# Patient Record
Sex: Female | Born: 1965 | Hispanic: Yes | Marital: Married | Smoking: Never smoker
Health system: Southern US, Community
[De-identification: ages and names within clinical notes are randomized; demographics above are authoritative.]

## PROBLEM LIST (undated history)

## (undated) DIAGNOSIS — H0016 Chalazion left eye, unspecified eyelid: Secondary | ICD-10-CM

## (undated) DIAGNOSIS — R350 Frequency of micturition: Secondary | ICD-10-CM

## (undated) DIAGNOSIS — K219 Gastro-esophageal reflux disease without esophagitis: Secondary | ICD-10-CM

## (undated) DIAGNOSIS — H0013 Chalazion right eye, unspecified eyelid: Secondary | ICD-10-CM

## (undated) DIAGNOSIS — M79662 Pain in left lower leg: Secondary | ICD-10-CM

## (undated) DIAGNOSIS — E559 Vitamin D deficiency, unspecified: Secondary | ICD-10-CM

## (undated) DIAGNOSIS — N809 Endometriosis, unspecified: Secondary | ICD-10-CM

## (undated) DIAGNOSIS — N840 Polyp of corpus uteri: Secondary | ICD-10-CM

## (undated) DIAGNOSIS — J45909 Unspecified asthma, uncomplicated: Secondary | ICD-10-CM

## (undated) DIAGNOSIS — M79661 Pain in right lower leg: Secondary | ICD-10-CM

## (undated) DIAGNOSIS — M5126 Other intervertebral disc displacement, lumbar region: Secondary | ICD-10-CM

## (undated) DIAGNOSIS — D649 Anemia, unspecified: Secondary | ICD-10-CM

## (undated) HISTORY — DX: Chalazion right eye, unspecified eyelid: H00.13

## (undated) HISTORY — DX: Endometriosis, unspecified: N80.9

## (undated) HISTORY — DX: Chalazion left eye, unspecified eyelid: H00.16

## (undated) HISTORY — DX: Other intervertebral disc displacement, lumbar region: M51.26

## (undated) HISTORY — DX: Vitamin D deficiency, unspecified: E55.9

## (undated) HISTORY — PX: PELVIC LAPAROSCOPY: SHX162

## (undated) HISTORY — DX: Gastro-esophageal reflux disease without esophagitis: K21.9

---

## 2003-06-30 HISTORY — PX: DILATION AND CURETTAGE OF UTERUS: SHX78

## 2006-06-29 HISTORY — PX: BREAST BIOPSY: SHX20

## 2015-05-17 ENCOUNTER — Telehealth: Payer: Self-pay | Admitting: Behavioral Health

## 2015-05-17 NOTE — Telephone Encounter (Signed)
Unable to reach patient at time of Pre-Visit Call.  Left message for patient to return call when available.    

## 2015-05-20 ENCOUNTER — Encounter: Payer: Self-pay | Admitting: Physician Assistant

## 2015-05-20 ENCOUNTER — Ambulatory Visit (INDEPENDENT_AMBULATORY_CARE_PROVIDER_SITE_OTHER): Payer: No Typology Code available for payment source | Admitting: Physician Assistant

## 2015-05-20 ENCOUNTER — Ambulatory Visit: Payer: Self-pay | Admitting: Physician Assistant

## 2015-05-20 ENCOUNTER — Telehealth: Payer: Self-pay | Admitting: Physician Assistant

## 2015-05-20 VITALS — BP 105/65 | HR 75 | Temp 97.9°F | Resp 16 | Ht 60.0 in | Wt 124.5 lb

## 2015-05-20 DIAGNOSIS — M5412 Radiculopathy, cervical region: Secondary | ICD-10-CM | POA: Diagnosis not present

## 2015-05-20 DIAGNOSIS — L270 Generalized skin eruption due to drugs and medicaments taken internally: Secondary | ICD-10-CM | POA: Insufficient documentation

## 2015-05-20 DIAGNOSIS — R7989 Other specified abnormal findings of blood chemistry: Secondary | ICD-10-CM | POA: Diagnosis not present

## 2015-05-20 LAB — VITAMIN D 25 HYDROXY (VIT D DEFICIENCY, FRACTURES): VITD: 46.61 ng/mL (ref 30.00–100.00)

## 2015-05-20 MED ORDER — DICLOFENAC SODIUM 3 % EX CREA: 1.0000 "application " | TOPICAL_CREAM | Freq: Every day | CUTANEOUS | Status: DC

## 2015-05-20 MED ORDER — METHYLPREDNISOLONE 4 MG PO TBPK: ORAL_TABLET | ORAL | Status: DC

## 2015-05-20 MED ORDER — NEOMYCIN-POLYMYXIN-DEXAMETH 3.5-10000-0.1 OP OINT: 1.0000 "application " | TOPICAL_OINTMENT | Freq: Three times a day (TID) | OPHTHALMIC | Status: DC

## 2015-05-20 NOTE — Telephone Encounter (Signed)
Caller name: Lucilla  Relation to pt: self Call back number: [phone] Pharmacy: [facility] [address] [place] [employer]  Reason for call: Pt called mentioning was seen at office today 05-20-15 and stated  was needing rx  neomycin-polymyxin b-dexamethasone (MAXITROL) 3.5-10000-0.1 SUSP  and DICLOFENAC SODIUM EX, she does not have any left , pt states did receive rx methylPREDNISolone (MEDROL DOSEPAK) 4 MG TBPK tablet  at the pharmacy but not the others that she needed as well. Pt also informed gave the wrong Pharmacy and would like to have it sent to the rx mentioned above, please change on chart. Please Advise.

## 2015-05-20 NOTE — Telephone Encounter (Signed)
Medications sent

## 2015-05-20 NOTE — Progress Notes (Signed)
Pre visit review using our clinic review tool, if applicable. No additional management support is needed unless otherwise documented below in the visit note/SLS  

## 2015-05-20 NOTE — Patient Instructions (Signed)
Please take the steroid as directed. This will help with neck pain and your rash. Stop using the topical cream on your neck as it is likely the cause of rash. Use extra-strength tylenol for breakthrough neck pain. Avoid heavy lifting or overexertion.  I will call you with your lab results. Use the compression stockings given as directed.

## 2015-05-21 ENCOUNTER — Telehealth: Payer: Self-pay | Admitting: Physician Assistant

## 2015-05-21 ENCOUNTER — Encounter: Payer: Self-pay | Admitting: Physician Assistant

## 2015-05-21 MED ORDER — DICLOFENAC SODIUM 3 % EX CREA: 1.0000 "application " | TOPICAL_CREAM | Freq: Two times a day (BID) | CUTANEOUS | Status: DC | PRN

## 2015-05-21 NOTE — Telephone Encounter (Signed)
Caller name: [facility]  Call back number: [phone]   Reason for call:  As per pharmacy Diclofenac Sodium 3 % CREA requesting clarification regarding supply or dose. Please clarify

## 2015-05-21 NOTE — Telephone Encounter (Signed)
Has been handled 

## 2015-05-21 NOTE — Progress Notes (Unsigned)
Received call from pharmacy. States they need to know how many grams you want patient to apply of the Diclofenac 3% Gel. Maximum amount per day is no more than 8 gm per pharmacist.

## 2015-05-21 NOTE — Telephone Encounter (Signed)
Caller name: [facility]   Relationship to patient: [facility]   Can be reached: [phone]    Reason for call: called back in to inform PCP and CMA that (per your previous discussion) medication is going to be 1,600. She's requesting a call back to discuss further options for pt.

## 2015-05-21 NOTE — Telephone Encounter (Signed)
Rx resent to pharmacy with provider's instructions/SLS

## 2015-05-21 NOTE — Telephone Encounter (Signed)
Apply up to twice daily as needed for low back pain

## 2015-05-21 NOTE — Progress Notes (Signed)
That is great. Thank you.  

## 2015-05-21 NOTE — Progress Notes (Signed)
2mg  twice daily

## 2015-05-21 NOTE — Progress Notes (Unsigned)
Per pharmacy Diclofenac 3% Gel would cost patient $1,600.00. States they can get Voltaren 1% for $80.00 she would need to apply 4gm bid to back. Advised this would be ok with you. Will call back if not just let me know.

## 2015-05-24 NOTE — Assessment & Plan Note (Signed)
Will repeat Vitamin D level today. 

## 2015-05-24 NOTE — Progress Notes (Signed)
Patient presents to clinic today c/o cervical neck pain, intermittent, x 1 month. Has now been consistent over the past week with radiation into RUE with tingling and weakness. Denies trauma or injury. Denies pain elsewhere. Has not taken anything for symptoms.  Patient also c/o burning red rash of neck s/p use of Aspercreme. States she stopped the medication once she realized the rash was appearing only in places where cream was used.  Patient endorses history of low vitamin D requiring treatment. Is requesting repeat labs.  Past Medical History  Diagnosis Date  . Chalazion of both eyes   . Vitamin D deficiency   . Lumbar herniated disc     L5  . Endometriosis     No current outpatient prescriptions on file prior to visit.   No current facility-administered medications on file prior to visit.    No Known Allergies  Family History  Problem Relation Age of Onset  . Prostate cancer Father 47    Deceased  . Lymphoma Mother 49    Neck-Deceased  . COPD Maternal Grandmother   . Healthy Brother     x5  . Healthy Sister     x8    Social History   Social History  . Marital Status: Married    Spouse Name: N/A  . Number of Children: N/A  . Years of Education: N/A   Social History Main Topics  . Smoking status: Never Smoker   . Smokeless tobacco: Never Used  . Alcohol Use: No  . Drug Use: No  . Sexual Activity: Not Asked   Other Topics Concern  . None   Social History Narrative  . None   Review of Systems  Constitutional: Negative for fever and weight loss.  HENT: Negative for ear discharge, ear pain, hearing loss and tinnitus.   Eyes: Negative for blurred vision, double vision, photophobia and pain.  Respiratory: Negative for cough and shortness of breath.   Cardiovascular: Negative for chest pain and palpitations.  Gastrointestinal: Negative for heartburn, nausea, vomiting, abdominal pain, diarrhea, constipation, blood in stool and melena.  Genitourinary:  Negative for dysuria, urgency, frequency, hematuria and flank pain.  Musculoskeletal: Positive for neck pain. Negative for falls.  Skin: Positive for rash.  Neurological: Negative for dizziness, loss of consciousness and headaches.  Endo/Heme/Allergies: Negative for environmental allergies.  Psychiatric/Behavioral: Negative for depression, suicidal ideas, hallucinations and substance abuse. The patient is not nervous/anxious and does not have insomnia.      BP 105/65 mmHg  Pulse 75  Temp(Src) 97.9 F (36.6 C) (Oral)  Resp 16  Ht 5' (1.524 m)  Wt 124 lb 8 oz (56.473 kg)  BMI 24.31 kg/m2  SpO2 97%  LMP 05/14/2015  Physical Exam  Constitutional: She is oriented to person, place, and time and well-developed, well-nourished, and in no distress.  HENT:  Head: Normocephalic and atraumatic.  Eyes: Conjunctivae are normal.  Cardiovascular: Normal rate, regular rhythm, normal heart sounds and intact distal pulses.   Pulmonary/Chest: Effort normal and breath sounds normal. No respiratory distress. She has no wheezes. She has no rales. She exhibits no tenderness.  Musculoskeletal:       Cervical back: She exhibits pain. She exhibits no tenderness and no bony tenderness.  Neurological: She is alert and oriented to person, place, and time.  Skin: Skin is warm and dry.     Psychiatric: Affect normal.  Vitals reviewed.   Recent Results (from the past 2160 hour(s))  Vitamin D (25 hydroxy)  Status: None   Collection Time: 05/20/15 10:30 AM  Result Value Ref Range   VITD 46.61 30.00 - 100.00 ng/mL    Assessment/Plan: Cervical radicular pain Rx Medrol dose pack. Continue Voltaren gel. Supportive measures reviewed. Follow-up if symptoms are not resolving.  Drug rash Aspercreme. Patient has stopped medication. Steroid pack for cervicalgia will also help with this. Supportive measures reviewed. Aspercreme added to allergies/intolerance list.  Low serum vitamin D Will repeat Vitamin D  level today.

## 2015-05-24 NOTE — Assessment & Plan Note (Signed)
Aspercreme. Patient has stopped medication. Steroid pack for cervicalgia will also help with this. Supportive measures reviewed. Aspercreme added to allergies/intolerance list.

## 2015-05-24 NOTE — Assessment & Plan Note (Signed)
Rx Medrol dose pack. Continue Voltaren gel. Supportive measures reviewed. Follow-up if symptoms are not resolving.

## 2015-07-24 ENCOUNTER — Ambulatory Visit (INDEPENDENT_AMBULATORY_CARE_PROVIDER_SITE_OTHER): Payer: No Typology Code available for payment source | Admitting: Physician Assistant

## 2015-07-24 ENCOUNTER — Encounter: Payer: Self-pay | Admitting: Physician Assistant

## 2015-07-24 VITALS — BP 104/71 | HR 73 | Temp 97.6°F | Ht 60.0 in | Wt 129.0 lb

## 2015-07-24 DIAGNOSIS — J208 Acute bronchitis due to other specified organisms: Secondary | ICD-10-CM

## 2015-07-24 MED ORDER — BENZONATATE 100 MG PO CAPS: 100.0000 mg | ORAL_CAPSULE | Freq: Two times a day (BID) | ORAL | Status: DC | PRN

## 2015-07-24 MED ORDER — ALBUTEROL SULFATE HFA 108 (90 BASE) MCG/ACT IN AERS: 2.0000 | INHALATION_SPRAY | Freq: Four times a day (QID) | RESPIRATORY_TRACT | Status: DC | PRN

## 2015-07-24 NOTE — Assessment & Plan Note (Signed)
With some mild wheeze. Supportive measures reviewed. Rx Albuterol sent in. Rx Tessalon for cough. Follow-up if symptoms are not resolved.

## 2015-07-24 NOTE — Progress Notes (Signed)
Pre visit review using our clinic review tool, if applicable. No additional management support is needed unless otherwise documented below in the visit note. 

## 2015-07-24 NOTE — Progress Notes (Signed)
    Patient presents to clinic today c/o 3 days of fatigue, dry cough, ear pressure bilaterally and chest tightness with wheezing. Denies fever, chills, chest pain. Denies history of asthma, allergy or history of smoking. Her husband is sick with similar symptoms.  Past Medical History  Diagnosis Date  . Chalazion of both eyes   . Vitamin D deficiency   . Lumbar herniated disc     L5  . Endometriosis     Current Outpatient Prescriptions on File Prior to Visit  Medication Sig Dispense Refill  . Diclofenac Sodium 3 % CREA Apply 1 application topically 2 (two) times daily as needed. Apply One Application Twice Daily As Needed For Low Back Pain (Patient not taking: Reported on 07/24/2015) 2500 g 1   No current facility-administered medications on file prior to visit.    No Known Allergies  Family History  Problem Relation Age of Onset  . Prostate cancer Father 6    Deceased  . Lymphoma Mother 22    Neck-Deceased  . COPD Maternal Grandmother   . Healthy Brother     x5  . Healthy Sister     x8    Social History   Social History  . Marital Status: Married    Spouse Name: N/A  . Number of Children: N/A  . Years of Education: N/A   Social History Main Topics  . Smoking status: Never Smoker   . Smokeless tobacco: Never Used  . Alcohol Use: No  . Drug Use: No  . Sexual Activity: Not Asked   Other Topics Concern  . None   Social History Narrative   Review of Systems - See HPI.  All other ROS are negative.  BP 104/71 mmHg  Pulse 73  Temp(Src) 97.6 F (36.4 C) (Oral)  Ht 5' (1.524 m)  Wt 129 lb (58.514 kg)  BMI 25.19 kg/m2  SpO2 100%  Physical Exam  Constitutional: She is oriented to person, place, and time and well-developed, well-nourished, and in no distress.  HENT:  Head: Normocephalic and atraumatic.  Right Ear: External ear normal.  Left Ear: External ear normal.  Nose: Nose normal.  Mouth/Throat: Oropharynx is clear and moist. No oropharyngeal  exudate.  TM within normal limits bilaterally  Eyes: Pupils are equal, round, and reactive to light.  Neck: Neck supple.  Cardiovascular: Normal rate, regular rhythm, normal heart sounds and intact distal pulses.   Pulmonary/Chest: Effort normal. No respiratory distress. She has wheezes. She has no rales. She exhibits no tenderness.  Neurological: She is alert and oriented to person, place, and time.  Skin: Skin is warm and dry. No rash noted.  Psychiatric: Affect normal.  Vitals reviewed.   Recent Results (from the past 2160 hour(s))  Vitamin D (25 hydroxy)     Status: None   Collection Time: 05/20/15 10:30 AM  Result Value Ref Range   VITD 46.61 30.00 - 100.00 ng/mL    Assessment/Plan: Viral bronchitis With some mild wheeze. Supportive measures reviewed. Rx Albuterol sent in. Rx Tessalon for cough. Follow-up if symptoms are not resolved.

## 2015-07-24 NOTE — Patient Instructions (Signed)
Please stay well hydrated and get plenty of rest. Place a humidifier in the bedroom. Get some Mucinex to take twice daily as directed. Use the Tessalon given as directed for cough. Use the Albuterol inhaler as directed for wheezing.  Follow-up if symptoms are not resolving.  Metered Dose Inhaler (No Spacer Used) Inhaled medicines are the basis of treatment for asthma and other breathing problems. Inhaled medicine can only be effective if used properly. Good technique assures that the medicine reaches the lungs. Metered dose inhalers (MDIs) are used to deliver a variety of inhaled medicines. These include quick relief or rescue medicines (such as bronchodilators) and controller medicines (such as corticosteroids). The medicine is delivered by pushing down on a metal canister to release a set amount of spray. If you are using different kinds of inhalers, use your quick relief medicine to open the airways 10-15 minutes before using a steroid, if instructed to do so by your health care provider. If you are unsure which inhalers to use and the order of using them, ask your health care provider, nurse, or respiratory therapist. HOW TO USE THE INHALER 1. Remove the cap from the inhaler. 2. If you are using the inhaler for the first time, you will need to prime it. Shake the inhaler for 5 seconds and release four puffs into the air, away from your face. Ask your health care provider or pharmacist if you have questions about priming your inhaler. 3. Shake the inhaler for 5 seconds before each breath in (inhalation). 4. Position the inhaler so that the top of the canister faces up. 5. Put your index finger on the top of the medicine canister. Your thumb supports the bottom of the inhaler. 6. Open your mouth. 7. Either place the inhaler between your teeth and place your lips tightly around the mouthpiece, or hold the inhaler 1-2 inches away from your open mouth. If you are unsure of which technique to use,  ask your health care provider. 8. Breathe out (exhale) normally and as completely as possible. 9. Press the canister down with the index finger to release the medicine. 10. At the same time as the canister is pressed, inhale deeply and slowly until your lungs are completely filled. This should take 4-6 seconds. Keep your tongue down. 11. Hold the medicine in your lungs for 5-10 seconds (10 seconds is best). This helps the medicine get into the small airways of your lungs. 12. Breathe out slowly, through pursed lips. Whistling is an example of pursed lips. 13. Wait at least 1 minute between puffs. Continue with the above steps until you have taken the number of puffs your health care provider has ordered. Do not use the inhaler more than your health care provider directs you to. 14. Replace the cap on the inhaler. 15. Follow the directions from your health care provider or the inhaler insert for cleaning the inhaler. If you are using a steroid inhaler, after your last puff, rinse your mouth with water, gargle, and spit out the water. Do not swallow the water. AVOID:  Inhaling before or after starting the spray of medicine. It takes practice to coordinate your breathing with triggering the spray.  Inhaling through the nose (rather than the mouth) when triggering the spray. HOW TO DETERMINE IF YOUR INHALER IS FULL OR NEARLY EMPTY You cannot know when an inhaler is empty by shaking it. Some inhalers are now being made with dose counters. Ask your health care provider for a prescription that has  a dose counter if you feel you need that extra help. If your inhaler does not have a counter, ask your health care provider to help you determine the date you need to refill your inhaler. Write the refill date on a calendar or your inhaler canister. Refill your inhaler 7-10 days before it runs out. Be sure to keep an adequate supply of medicine. This includes making sure it has not expired, and making sure you  have a spare inhaler. SEEK MEDICAL CARE IF:  Symptoms are only partially relieved with your inhaler.  You are having trouble using your inhaler.  You experience an increase in phlegm. SEEK IMMEDIATE MEDICAL CARE IF:  You feel little or no relief with your inhalers. You are still wheezing and feeling shortness of breath, tightness in your chest, or both.  You have dizziness, headaches, or a fast heart rate.  You have chills, fever, or night sweats.  There is a noticeable increase in phlegm production, or there is blood in the phlegm. MAKE SURE YOU:  Understand these instructions.  Will watch your condition.  Will get help right away if you are not doing well or get worse.   This information is not intended to replace advice given to you by your health care provider. Make sure you discuss any questions you have with your health care provider.   Document Released: 04/12/2007 Document Revised: 07/06/2014 Document Reviewed: 12/01/2012 Elsevier Interactive Patient Education [employer].

## 2015-08-12 ENCOUNTER — Other Ambulatory Visit (HOSPITAL_COMMUNITY)
Admission: RE | Admit: 2015-08-12 | Discharge: 2015-08-12 | Disposition: A | Discharge status: Home / Self Care | Payer: No Typology Code available for payment source | Source: Ambulatory Visit | Attending: Gynecology | Admitting: Gynecology

## 2015-08-12 ENCOUNTER — Ambulatory Visit (INDEPENDENT_AMBULATORY_CARE_PROVIDER_SITE_OTHER): Payer: No Typology Code available for payment source | Admitting: Gynecology

## 2015-08-12 ENCOUNTER — Encounter: Payer: Self-pay | Admitting: Gynecology

## 2015-08-12 VITALS — BP 118/76 | Ht 62.0 in | Wt 124.0 lb

## 2015-08-12 DIAGNOSIS — Z8639 Personal history of other endocrine, nutritional and metabolic disease: Secondary | ICD-10-CM | POA: Diagnosis not present

## 2015-08-12 DIAGNOSIS — Z1151 Encounter for screening for human papillomavirus (HPV): Secondary | ICD-10-CM | POA: Insufficient documentation

## 2015-08-12 DIAGNOSIS — Z01419 Encounter for gynecological examination (general) (routine) without abnormal findings: Secondary | ICD-10-CM | POA: Insufficient documentation

## 2015-08-12 DIAGNOSIS — N912 Amenorrhea, unspecified: Secondary | ICD-10-CM

## 2015-08-12 DIAGNOSIS — E785 Hyperlipidemia, unspecified: Secondary | ICD-10-CM | POA: Diagnosis not present

## 2015-08-12 DIAGNOSIS — L659 Nonscarring hair loss, unspecified: Secondary | ICD-10-CM | POA: Diagnosis not present

## 2015-08-12 LAB — PREGNANCY, URINE: Preg Test, Ur: NEGATIVE

## 2015-08-12 NOTE — Progress Notes (Signed)
Chelsea Barrett Dec 09, 1965 [id number]   History:    50 y.o.  for annual gyn exam who is a new patient to the practice. Patient with several complaints today. Patient stated she has not had a menstrual cycle since December and January she had a slightly brownish discharge and has not had any bleeding since December. She denies any nausea, vomiting, no nipple discharge or unusual headaches or double vision. She is not using any form of contraception. 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 Patient had laparoscopy in another facility several years ago and she was diagnosed with endometriosis and occlusion of the left fallopian tube. She denies any vasomotor symptoms but she does complain of some alopecia and has had vitamin D deficiency in the past which she is currently taking calcium and vitamin D. Also she had brought lab work from her prior physician back in April and her BUN/creatinine ratio was slightly elevated at 27 normal range 9-23 and also she had a vitamin D level that was 19.8 and she was treated with 50,000 units every weekly for 12 weeks and stated that on follow-up her vitamin D level was back to normal she's taken thousand units daily. Also her labs that shown that her total cholesterol had been elevated at 224 and her LDL was elevated at 120 the rest of the lipid profile was normal. Her urine Sudie Grumbling today was negative.  Past medical history,surgical history, family history and social history were all reviewed and documented in the EPIC chart.  Gynecologic History Patient's last menstrual period was 06/11/2015. Contraception: none Last Pap: Several years ago different facility. Results were: Patient reports it was normal Last mammogram: 2016. Results were: Dense breasts but normal  Obstetric History OB History  Gravida Para Term Preterm AB SAB TAB Ectopic Multiple Living  0 0 0 0 0 0 0 0 0 0          ROS: A ROS was performed and pertinent positives and  negatives are included in the history.  GENERAL: No fevers or chills. HEENT: No change in vision, no earache, sore throat or sinus congestion. NECK: No pain or stiffness. CARDIOVASCULAR: No chest pain or pressure. No palpitations. PULMONARY: No shortness of breath, cough or wheeze. GASTROINTESTINAL: No abdominal pain, nausea, vomiting or diarrhea, melena or bright red blood per rectum. GENITOURINARY: No urinary frequency, urgency, hesitancy or dysuria. MUSCULOSKELETAL: No joint or muscle pain, no back pain, no recent trauma. DERMATOLOGIC: No rash, no itching, no lesions. ENDOCRINE: No polyuria, polydipsia, no heat or cold intolerance. No recent change in weight. HEMATOLOGICAL: No anemia or easy bruising or bleeding. NEUROLOGIC: No headache, seizures, numbness, tingling or weakness. PSYCHIATRIC: No depression, no loss of interest in normal activity or change in sleep pattern.     Exam: chaperone present  BP 118/76 mmHg  Ht 5\' 2"  (1.575 m)  Wt 124 lb (56.246 kg)  BMI 22.67 kg/m2  LMP 06/11/2015  Body mass index is 22.67 kg/(m^2).  General appearance : Well developed well nourished female. No acute distress HEENT: Eyes: no retinal hemorrhage or exudates,  Neck supple, trachea midline, no carotid bruits, no thyroidmegaly Lungs: Clear to auscultation, no rhonchi or wheezes, or rib retractions  Heart: Regular rate and rhythm, no murmurs or gallops Breast:Examined in sitting and supine position were symmetrical in appearance, no palpable masses or tenderness,  no skin retraction, no nipple inversion, no nipple discharge, no skin discoloration, no axillary or supraclavicular lymphadenopathy Abdomen: no palpable masses or  tenderness, no rebound or guarding Extremities: no edema or skin discoloration or tenderness  Pelvic:  Bartholin, Urethra, Skene Glands: Within normal limits             Vagina: No gross lesions or discharge  Cervix: No gross lesions or discharge  Uterus  anteverted, normal size,  shape and consistency, non-tender and mobile  Adnexa  Without masses or tenderness  Anus and perineum  normal   Rectovaginal  normal sphincter tone without palpated masses or tenderness             Hemoccult not indicated     Assessment/Plan:  50 y.o. female for annual exam who will return later in this week for the following screening blood work: Fasting lipid profile, TSH, CBC, urinalysis and fasting lipid profile. As a result of her secondary amenorrhea we are going to check an [facility] along with her prolactin. Because her vitamin D deficiency in the past we will check a vitamin D level today. Because of her alopecia we'll check a testosterone level today as well. A Pap smear with HPV screening was done today. She was provided with a requisition to schedule a three-dimensional mammogram for next month. She will return back to the office next week to discuss her results and plan a course of management.   Terrance Mass MD, 1:02 PM 08/12/2015

## 2015-08-13 ENCOUNTER — Other Ambulatory Visit: Payer: No Typology Code available for payment source

## 2015-08-13 LAB — CBC WITH DIFFERENTIAL/PLATELET
BASOS ABS: 0.1 10*3/uL (ref 0.0–0.1)
Basophils Relative: 1 % (ref 0–1)
EOS ABS: 0.2 10*3/uL (ref 0.0–0.7)
EOS PCT: 3 % (ref 0–5)
HCT: 40.2 % (ref 36.0–46.0)
Hemoglobin: 13.1 g/dL (ref 12.0–15.0)
LYMPHS ABS: 2.2 10*3/uL (ref 0.7–4.0)
LYMPHS PCT: 34 % (ref 12–46)
MCH: 27.3 pg (ref 26.0–34.0)
MCHC: 32.6 g/dL (ref 30.0–36.0)
MCV: 83.9 fL (ref 78.0–100.0)
MPV: 10.8 fL (ref 8.6–12.4)
Monocytes Absolute: 0.3 10*3/uL (ref 0.1–1.0)
Monocytes Relative: 5 % (ref 3–12)
NEUTROS PCT: 57 % (ref 43–77)
Neutro Abs: 3.6 10*3/uL (ref 1.7–7.7)
PLATELETS: 296 10*3/uL (ref 150–400)
RBC: 4.79 MIL/uL (ref 3.87–5.11)
RDW: 13.7 % (ref 11.5–15.5)
WBC: 6.4 10*3/uL (ref 4.0–10.5)

## 2015-08-13 LAB — THYROID PANEL WITH TSH
FREE THYROXINE INDEX: 1.8 (ref 1.4–3.8)
T3 UPTAKE: 28 % (ref 22–35)
T4 TOTAL: 6.5 ug/dL (ref 4.5–12.0)
TSH: 1.64 mIU/L

## 2015-08-13 LAB — URINALYSIS W MICROSCOPIC + REFLEX CULTURE
Bacteria, UA: NONE SEEN [HPF]
Bilirubin Urine: NEGATIVE
CASTS: NONE SEEN [LPF]
CRYSTALS: NONE SEEN [HPF]
Glucose, UA: NEGATIVE
HGB URINE DIPSTICK: NEGATIVE
KETONES UR: NEGATIVE
Leukocytes, UA: NEGATIVE
NITRITE: NEGATIVE
PH: 5 (ref 5.0–8.0)
Protein, ur: NEGATIVE
RBC / HPF: NONE SEEN RBC/HPF (ref <=2)
SPECIFIC GRAVITY, URINE: 1.028 (ref 1.001–1.035)
WBC, UA: NONE SEEN WBC/HPF (ref <=5)
YEAST: NONE SEEN [HPF]

## 2015-08-13 LAB — COMPREHENSIVE METABOLIC PANEL
ALBUMIN: 3.7 g/dL (ref 3.6–5.1)
ALK PHOS: 49 U/L (ref 33–115)
ALT: 18 U/L (ref 6–29)
AST: 17 U/L (ref 10–35)
BILIRUBIN TOTAL: 0.6 mg/dL (ref 0.2–1.2)
BUN: 17 mg/dL (ref 7–25)
CO2: 26 mmol/L (ref 20–31)
CREATININE: 0.72 mg/dL (ref 0.50–1.10)
Calcium: 8.8 mg/dL (ref 8.6–10.2)
Chloride: 104 mmol/L (ref 98–110)
Glucose, Bld: 84 mg/dL (ref 65–99)
Potassium: 4.3 mmol/L (ref 3.5–5.3)
SODIUM: 137 mmol/L (ref 135–146)
TOTAL PROTEIN: 6.5 g/dL (ref 6.1–8.1)

## 2015-08-13 LAB — LIPID PANEL
CHOLESTEROL: 204 mg/dL — AB (ref 125–200)
HDL: 92 mg/dL (ref >=46)
LDL Cholesterol: 101 mg/dL (ref <130)
TRIGLYCERIDES: 55 mg/dL (ref <150)
Total CHOL/HDL Ratio: 2.2 Ratio (ref <=5.0)
VLDL: 11 mg/dL (ref <30)

## 2015-08-14 LAB — TESTOSTERONE: TESTOSTERONE: 39 ng/dL

## 2015-08-14 LAB — FOLLICLE STIMULATING HORMONE: FSH: 6.2 m[IU]/mL

## 2015-08-14 LAB — VITAMIN D 25 HYDROXY (VIT D DEFICIENCY, FRACTURES): Vit D, 25-Hydroxy: 30 ng/mL (ref 30–100)

## 2015-08-14 LAB — CYTOLOGY - PAP

## 2015-08-14 LAB — PROLACTIN: PROLACTIN: 11.7 ng/mL

## 2015-08-22 ENCOUNTER — Telehealth: Payer: Self-pay | Admitting: *Deleted

## 2015-08-22 ENCOUNTER — Ambulatory Visit (INDEPENDENT_AMBULATORY_CARE_PROVIDER_SITE_OTHER): Payer: No Typology Code available for payment source | Admitting: Gynecology

## 2015-08-22 ENCOUNTER — Encounter: Payer: Self-pay | Admitting: Gynecology

## 2015-08-22 ENCOUNTER — Other Ambulatory Visit: Payer: Self-pay

## 2015-08-22 VITALS — BP 102/68

## 2015-08-22 DIAGNOSIS — Z309 Encounter for contraceptive management, unspecified: Secondary | ICD-10-CM

## 2015-08-22 DIAGNOSIS — N979 Female infertility, unspecified: Secondary | ICD-10-CM

## 2015-08-22 DIAGNOSIS — N3942 Incontinence without sensory awareness: Secondary | ICD-10-CM

## 2015-08-22 DIAGNOSIS — Z8639 Personal history of other endocrine, nutritional and metabolic disease: Secondary | ICD-10-CM | POA: Diagnosis not present

## 2015-08-22 DIAGNOSIS — N915 Oligomenorrhea, unspecified: Secondary | ICD-10-CM

## 2015-08-22 DIAGNOSIS — E785 Hyperlipidemia, unspecified: Secondary | ICD-10-CM | POA: Diagnosis not present

## 2015-08-22 DIAGNOSIS — Z30011 Encounter for initial prescription of contraceptive pills: Secondary | ICD-10-CM

## 2015-08-22 DIAGNOSIS — Z1231 Encounter for screening mammogram for malignant neoplasm of breast: Secondary | ICD-10-CM

## 2015-08-22 DIAGNOSIS — L659 Nonscarring hair loss, unspecified: Secondary | ICD-10-CM | POA: Diagnosis not present

## 2015-08-22 MED ORDER — NORETHIN ACE-ETH ESTRAD-FE 1-20 MG-MCG PO TABS: 1.0000 | ORAL_TABLET | Freq: Every day | ORAL | Status: DC

## 2015-08-22 MED ORDER — NORETHINDRONE ACET-ETHINYL EST 1.5-30 MG-MCG PO TABS: 1.0000 | ORAL_TABLET | Freq: Every day | ORAL | Status: DC

## 2015-08-22 NOTE — Telephone Encounter (Signed)
Chelsea Barrett will relay to patient. Correct Rx sent

## 2015-08-22 NOTE — Progress Notes (Signed)
   Patient is a 50 year old that presented to the office today to discuss her lab tests. She was seen as a new patient for the first time on 08/12/2015. Her history is as follows:  "Patient with several complaints today. Patient stated she has not had a menstrual cycle since December and January she had a slightly brownish discharge and has not had any bleeding since December. She denies any nausea, vomiting, no nipple discharge or unusual headaches or double vision. She is not using any form of contraception. 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 Patient had laparoscopy in another facility several years ago and she was diagnosed with endometriosis and occlusion of the left fallopian tube. She denies any vasomotor symptoms but she does complain of some alopecia and has had vitamin D deficiency in the past which she is currently taking calcium and vitamin D. Also she had brought lab work from her prior physician back in April and her BUN/creatinine ratio was slightly elevated at 27 normal range 9-23 and also she had a vitamin D level that was 19.8 and she was treated with 50,000 units every weekly for 12 weeks and stated that on follow-up her vitamin D level was back to normal she's taken thousand units daily. Also her labs that shown that her total cholesterol had been elevated at 224 and her LDL was elevated at 120 the rest of the lipid profile was normal."  She informed me that she had a spontaneous menses last Friday which lasted for 6 days was very heavy the first 3 days. We reviewed all her labs and the following was discussed: Comprehensive metabolic panel, vitamin D level, CBC, TSH, FSH, prolactin were all normal. Her total testosterone was 39. Her lipid profile normal with the exception of total cholesterol slightly elevated at 204. Pap smear was normal. Patient due for mammogram in April of this year.  Patient had informed me today also that she did experience some  urinary leakage when she was laughing recently but there were a few episodes. She denies nocturia. She denies any urge incontinence or any urinary frequency. Patient has had history in the past of primary infertility no longer interested in getting pregnant. We discussed treatment options to include the following:  Option A: Provera 10 mg daily for 10 days if she does not have a spontaneous menses every 30 days and if a home pregnancy test is negative. Option B: Low-dose 20 g oral contraceptive pill for contraception as well as to regulate her cycle and also for smooth transition into the menopause since she is currently 50 years of age.  She has decided to proceed with low dose oral contraceptive pill. The risk benefits and pros and cons were discussed. Patient would no prior history of any clotting or bleeding disorders or any family history.  Assessment/plan: #1 oligomenorrhea and negative workup will be regulated with low-dose 20 g oral contraceptive pill such as Loestrin 1/20 FE which she will start today. Literature information was provided in Spanish #2 isolated episodes of stress urinary incontinence literature information on Keagle exercises was provided in Spanish #3 past history vitamin D deficiency currently taking vitamin D 2000 units daily recent vitamin D level was normal.  Greater than 90% of the time was spent in counseling correlating care for this patient with chronic anovulation perimenopausal. Time of consultation 20 minutes.

## 2015-08-22 NOTE — Patient Instructions (Addendum)
Informacin sobre los Electronics engineer (Oral Contraception Information) Los anticonceptivos orales (ACO) son medicamentos que se utilizan para Therapist, occupational. Su funcin es [employer] ovarios liberen vulos. Las hormonas de los ACO tambin hacen que el moco cervical se haga ms espeso, lo que evita que el esperma ingrese al tero. Tambin hacen que la membrana que recubre internamente al tero se vuelva ms fina, lo que no permite que el huevo fertilizado se adhiera a la pared del tero. Los ACO son muy efectivos cuando se toman exactamente como se prescriben. Sin embargo, no previenen contra las enfermedades de transmisin sexual (ETS). La prctica del sexo seguro, como el uso de preservativos, junto con la [place], [place] a prevenir ese tipo de enfermedades.  Antes de tomar la pldora, usted debe hacerse un examen fsico y un test de Pap. El mdico podr indicarle anlisis de [place], si es necesario. El mdico se asegurar de que usted sea [place] buena candidata para usar anticonceptivos orales. Converse con su mdico acerca de los posibles efectos secundarios de los ACO que podran recetarle. Cuando se inicia el uso de ACO, puede llevar 2 a 3 meses para que su organismo se adapte a los cambios en los niveles hormonales.  TIPOS DE ANTICONCEPTIVOS ORALES 1. Pldora combinada: esta pldora contiene las hormonas estrgeno y progestina (progesterona sinttica). La pldora combinada viene en envases para [address], [place]. Algunos tipos de pldoras combinadas deben tomarse de manera continua (pldoras para 365 das). En los envases para [address], usted no tomar las pldoras durante 7 das despus de la ltima pldora. En los envases para [address] la pldora se toma [employer]. Las ltimas 7 no contienen hormonas. Ciertos tipos de pldoras tienen ms de 21 pldoras que contienen hormonas. En los envases para [address] las primeras 84 pldoras contienen ambas hormonas y las ltimas 7 pldoras  no contienen hormonas o contienen slo Therapist, occupational. 2. La minipldora: esta pldora contiene la hormona progesterona solamente. Es necesario tomarla todos los das de [place] continua. Es importante que las tome a la misma hora todos [place]. Viene en envases de 28 pldoras. Las 28 pldoras contienen la hormona.  Gail los sntomas premenstruales.   Se [place] para tratar los [employer].   Regula el ciclo menstrual.   Disminuye el ciclo menstrual abundante.   Puede mejorar el acn, segn el tipo de pldora.   Trata hemorragias uterinas anormales.   Trata el sndrome ovrico poliqustico.   Trata la endometriosis.   Pueden usarse como anticonceptivo de Freight forwarder.  FACTORES QUE PUEDEN HACER QUE LOS ANTICONCEPTIVOS ORALES SEAN MENOS EFECTIVOS Pueden ser menos efectivos si:   Olvid tomar la [employer] a la misma hora.   Tiene una enfermedad estomacal o intestinal que disminuye la absorcin de la pldora.   Ingiere simultneamente los anticonceptivos orales junto con otros medicamentos que los hacen menos efectivos, como antibiticos, ciertos medicamentos para el VIH y algunos medicamentos para las convulsiones.   Usted toma anticonceptivos orales que han vencido.   Cuando se [place] el envase de 21 das, se olvida de recomenzar el uso [employer] 7.  RIESGOS ASOCIADOS AL USO DE ANTICONCEPTIVOS ORALES  Los anticonceptivos orales pueden en algunos casos causar efectos secundarios como:  Dolor de [place].  Nuseas.  Inflamacin mamaria.  Hemorragia vaginal o manchado irregular. Las pldoras combinadas tambin se asocian a un pequeo aumento en el riesgo de:  Cogulos  sanguneos.  Ataque cardaco.  Ictus.   Esta informacin no tiene Marine scientist el consejo del mdico. Asegrese de hacerle al mdico cualquier pregunta que tenga.   Document Released: 03/25/2005 Document Revised:  04/05/2013 Elsevier Interactive Patient Education 2016 [place] de Kegel  (Kegel Exercises) El objetivo de los ejercicios de Kegel es aislar y Chief Technology Officer los msculos del suelo plvico. Estos msculos actan como una hamaca que soporta el recto, la vagina, el intestino delgado y [place]. A medida que los msculos se debilitan, se hunden y esos rganos son desplazados de sus posiciones normales. Los ejercicios de Kegel fortalecen los msculos del suelo plvico y ayudan a Garment/textile technologist control de la vejiga y del intestino, mejoran la respuesta sexual y Therapist, music a Proofreader problemas y Dispensing optician durante el [place]. Se pueden hacer en cualquier lugar y en cualquier momento.  [place] LOS EJERCICIOS DE KEGEL  3. Localice los msculos del suelo plvico. Para ello apriete (contraiga) los msculos que se utilizan para tratar de Scientist, water quality el flujo de [place]. Usted se sentir una opresin en el rea vaginal (mujeres) y que se eleva y comprime la zona rectal (hombres y mujeres). 4. Para comenzar, contraiga los msculos plvicos durante 2 a 5 segundos y despus reljelos durante 2 a 5 segundos. Esta es una serie. Repita 4 a 5 series con una breve pausa en el medio. 5. Contraiga los msculos de la pelvis durante 8 a 10 segundos y despus reljelos durante 8 a 10 segundos. Repita 4 a 5 series. Si no puede contraer los msculos de la pelvis durante 8 a 10 segundos, trate de 5 a 7 segundos y Higher education careers adviser 8 a 10 segundos. Su objetivo es Optometrist 4 a 5 series de 10 Passenger transport manager. Mantenga el estmago, las nalgas y las piernas relajadas [place]. Realice ambas series de contracciones, cortas y largas. [place] posiciones. [place] contracciones 3 a 4 veces por Training and development officer. [place] series mientras est:    Acostado en la cama por la [place].  De pie en el almuerzo.  Sentado por las tardes.  Acostado en la cama por la noche.  Debe hacer 40 a 50 contracciones por  da. No realice ms ejercicios de Kegel por da que lo recomendado. El exceso de ejercicio puede causar fatiga muscular. Contine con estos ejercicios durante al menos 15 a 20 semanas o segn las indicaciones de su mdico.    Esta informacin no tiene Marine scientist el consejo del mdico. Asegrese de hacerle al mdico cualquier pregunta que tenga.   Document Released: 06/01/2012 Elsevier Interactive Patient Education [employer].

## 2015-08-22 NOTE — Telephone Encounter (Signed)
I want her to take the Loestrin 12/20 there are 21 tablets in the remainder on iron tablet take as prescribed

## 2015-08-22 NOTE — Telephone Encounter (Signed)
Pt called Chelsea Barrett with questions regarding her birth control Rx she received today at office visit.   1. Pt said you told her you were going to prescribe a 28 day pack of pills, but only at 21 day pack was sent.  2. Should patient have Loestrin 1/20 FE pack? Or Loestrin Fe 1.5/30 mcg tablet? You office note states 20 mcg tablet will be prescribed, but 1.5/30 mcg was sent to pharmacy.  Please advise

## 2015-08-27 ENCOUNTER — Other Ambulatory Visit: Payer: Self-pay | Admitting: Physician Assistant

## 2015-09-18 ENCOUNTER — Ambulatory Visit: Payer: Self-pay | Admitting: Gynecology

## 2015-10-02 ENCOUNTER — Ambulatory Visit
Admission: RE | Admit: 2015-10-02 | Discharge: 2015-10-02 | Disposition: A | Discharge status: Home / Self Care | Payer: No Typology Code available for payment source | Source: Ambulatory Visit

## 2015-10-02 ENCOUNTER — Other Ambulatory Visit: Payer: Self-pay

## 2015-10-02 DIAGNOSIS — Z1231 Encounter for screening mammogram for malignant neoplasm of breast: Secondary | ICD-10-CM

## 2015-12-05 ENCOUNTER — Encounter: Payer: Self-pay | Admitting: Medical

## 2015-12-05 ENCOUNTER — Ambulatory Visit (INDEPENDENT_AMBULATORY_CARE_PROVIDER_SITE_OTHER): Payer: No Typology Code available for payment source | Admitting: Medical

## 2015-12-05 VITALS — BP 102/64 | HR 59 | Temp 98.1°F | Resp 16 | Ht 62.0 in | Wt 121.2 lb

## 2015-12-05 DIAGNOSIS — L858 Other specified epidermal thickening: Secondary | ICD-10-CM

## 2015-12-05 DIAGNOSIS — K140 Glossitis: Secondary | ICD-10-CM

## 2015-12-05 DIAGNOSIS — L739 Follicular disorder, unspecified: Secondary | ICD-10-CM | POA: Diagnosis not present

## 2015-12-05 DIAGNOSIS — K146 Glossodynia: Secondary | ICD-10-CM | POA: Diagnosis not present

## 2015-12-05 DIAGNOSIS — Q829 Congenital malformation of skin, unspecified: Secondary | ICD-10-CM | POA: Diagnosis not present

## 2015-12-05 MED ORDER — CEPHALEXIN 500 MG PO CAPS: 500.0000 mg | ORAL_CAPSULE | Freq: Two times a day (BID) | ORAL | Status: DC

## 2015-12-05 MED ORDER — MAGIC MOUTHWASH: ORAL | Status: DC

## 2015-12-05 MED ORDER — FLUCONAZOLE 150 MG PO TABS: 150.0000 mg | ORAL_TABLET | Freq: Once | ORAL | Status: DC

## 2015-12-05 MED FILL — CMPD MMW LID/MAALOX/DIPHEN: 10 days supply | Qty: 200 | Fill #0

## 2015-12-05 NOTE — Patient Instructions (Addendum)
For your tongue inflammation and pain. Will rx magic mouth wash.   For glossitis work up can get b12 level and cbc.  For sore area on lip can use oragel.  If by Sunday mouth still sore then use diflucan.(fungus can sometimes cause above symptoms)  For keratosis pilaris and folliculitis rx cephalexin antibiotic and can use [employer] Exfoliating scrub.  Follow up in 2 weeks or as needed

## 2015-12-05 NOTE — Progress Notes (Signed)
Pre visit review using our clinic review tool, if applicable. No additional management support is needed unless otherwise documented below in the visit note. 

## 2015-12-05 NOTE — Progress Notes (Signed)
   Subjective:    Patient ID: Chelsea Barrett, female    DOB: [birth year], 50 y.o.   MRN: [id number]  HPI  Pt in with some mild irritation. When she swallows. Some pain on tip of tongue and tender area inside of lip. Pt states she eats healthy diet. This came up just 2 days ago.   Pt has some bumps on her arms 2 months ago. No pain. No itching. She states never had this before.   No treatment otc for any of the above conditions.    Review of Systems  Constitutional: Negative for fever, chills and fatigue.  HENT: Negative for congestion, facial swelling, nosebleeds, rhinorrhea, sinus pressure and sore throat.        Tongue pain an lip pain. See hpi.  Cardiovascular: Negative for chest pain and palpitations.  Gastrointestinal: Negative for abdominal pain.  Skin: Positive for rash.       See  hpi.       Objective:   Physical Exam  General- No acute distress. Pleasant patient. Lungs- Clear, even and unlabored. Heart- regular rate and rhythm. Neurologic- CNII- XII grossly intact.  Mouth- mild red inflamed appearance to tongue. More at rt side  tip of tongue. This area very sensitive. Lower lip- no obvious abnormality. But faint tender buccal mucosa. Area that gum/lip near front lower teeth.   Skin- on back of both arms. Appearance of moderate keratosis pilaris and follicuitis.       Assessment & Plan:  For your tongue inflammation and pain. Will rx magic mouth wash.   For glossitis work up can get b12 level and cbc.  For sore area on lip can use oragel.  If by Sunday mouth still sore then use diflucan.(fungus can sometimes cause above symptoms)  For keratosis pilaris and folliculitis rx cephalexin antibiotic and can use [employer] Exfoliating scrub.  Follow up in 2 weeks or as needed

## 2015-12-06 ENCOUNTER — Ambulatory Visit: Payer: No Typology Code available for payment source | Admitting: Physician Assistant

## 2015-12-06 LAB — VITAMIN B12: Vitamin B-12: >1500 pg/mL — ABNORMAL HIGH (ref 211–911)

## 2015-12-06 LAB — CBC WITH DIFFERENTIAL/PLATELET
BASOS PCT: 1 % (ref 0.0–3.0)
Basophils Absolute: 0.1 10*3/uL (ref 0.0–0.1)
EOS ABS: 0.5 10*3/uL (ref 0.0–0.7)
Eosinophils Relative: 5.8 % — ABNORMAL HIGH (ref 0.0–5.0)
HCT: 39.6 % (ref 36.0–46.0)
Hemoglobin: 13.3 g/dL (ref 12.0–15.0)
LYMPHS ABS: 2.7 10*3/uL (ref 0.7–4.0)
Lymphocytes Relative: 30.9 % (ref 12.0–46.0)
MCHC: 33.5 g/dL (ref 30.0–36.0)
MCV: 83.5 fl (ref 78.0–100.0)
MONO ABS: 0.5 10*3/uL (ref 0.1–1.0)
Monocytes Relative: 6 % (ref 3.0–12.0)
NEUTROS ABS: 4.9 10*3/uL (ref 1.4–7.7)
NEUTROS PCT: 56.3 % (ref 43.0–77.0)
Platelets: 242 10*3/uL (ref 150.0–400.0)
RBC: 4.74 Mil/uL (ref 3.87–5.11)
RDW: 12.7 % (ref 11.5–15.5)
WBC: 8.8 10*3/uL (ref 4.0–10.5)

## 2015-12-12 ENCOUNTER — Ambulatory Visit: Payer: No Typology Code available for payment source | Admitting: Family Medicine

## 2015-12-12 ENCOUNTER — Ambulatory Visit (INDEPENDENT_AMBULATORY_CARE_PROVIDER_SITE_OTHER): Payer: No Typology Code available for payment source | Admitting: Family Medicine

## 2015-12-12 VITALS — BP 98/65 | HR 81 | Temp 98.5°F | Ht 62.0 in | Wt 119.2 lb

## 2015-12-12 DIAGNOSIS — Q829 Congenital malformation of skin, unspecified: Secondary | ICD-10-CM | POA: Diagnosis not present

## 2015-12-12 DIAGNOSIS — K1379 Other lesions of oral mucosa: Secondary | ICD-10-CM | POA: Diagnosis not present

## 2015-12-12 DIAGNOSIS — L858 Other specified epidermal thickening: Secondary | ICD-10-CM

## 2015-12-12 MED ORDER — CLOTRIMAZOLE 10 MG MT TROC: 10.0000 mg | Freq: Every day | OROMUCOSAL | Status: DC

## 2015-12-12 MED ORDER — FLUOCINONIDE 0.05 % EX GEL: 1.0000 "application " | Freq: Two times a day (BID) | CUTANEOUS | Status: DC

## 2015-12-12 NOTE — Progress Notes (Signed)
Pre visit review using our clinic review tool, if applicable. No additional management support is needed unless otherwise documented below in the visit note. 

## 2015-12-12 NOTE — Progress Notes (Signed)
[place] at [facility] [address] [place], [place] [address] [phone]  Date:  12/12/2015   Name:  Chelsea Barrett   DOB:  07-09-1965   MRN:  [id number]  PCP:  Chelsea Rio, PA-C    Chief Complaint: Follow-up   History of Present Illness:  Chelsea Barrett is a 50 y.o. very pleasant female patient who presents with the following:  Here today to recheck mouth sores.  She was seen here on 6/8 and treated with diflucan/ magic mouthwsh for glossitis She notes that she is wrose as far as her mouth pain and sores.   She otherwise feels well She has been under stress as her husband recently had a stroke.  She used keflex for her folliculitis and this is improved- she still has bumps on her upper arms c/w keratoris pilaris   LMP 5/29 No fever or chills It does hurt to eat but she is still eating  Wt Readings from Last 3 Encounters:  12/12/15 119 lb 3.2 oz (54.069 kg)  12/05/15 121 lb 3.2 oz (54.976 kg)  08/12/15 124 lb (56.246 kg)     Patient Active Problem List   Diagnosis Date Noted  . Hyperlipidemia 08/12/2015  . H/O vitamin D deficiency 08/12/2015  . Alopecia 08/12/2015  . Amenorrhea 08/12/2015  . Cervical radicular pain 05/20/2015  . Drug rash 05/20/2015  . Low serum vitamin D 05/20/2015    Past Medical History  Diagnosis Date  . Chalazion of both eyes   . Vitamin D deficiency   . Lumbar herniated disc     L5  . Endometriosis     Past Surgical History  Procedure Laterality Date  . Breast biopsy  2008    No malignancy  . Dilation and curettage of uterus  2005  . Pelvic laparoscopy      Social History  Substance Use Topics  . Smoking status: Never Smoker   . Smokeless tobacco: Never Used  . Alcohol Use: No    Family History  Problem Relation Age of Onset  . Prostate cancer Father 43    Deceased  . Lymphoma Mother 27    Neck-Deceased  . COPD Maternal Grandmother   . Healthy Brother    x5  . Healthy Sister     x8    No Known Allergies  Medication list has been reviewed and updated.  Current Outpatient Prescriptions on File Prior to Visit  Medication Sig Dispense Refill  . cephALEXin (KEFLEX) 500 MG capsule Take 1 capsule (500 mg total) by mouth 2 (two) times daily. 14 capsule 0  . diclofenac sodium (VOLTAREN) 1 % GEL APPLY ONE APPLICATION (FOUR GRAMS) TWICE DAILY TO AFFECTED AREA AS NEEDED FOR BACK PAIN  12  . fluconazole (DIFLUCAN) 150 MG tablet Take 1 tablet (150 mg total) by mouth once. 1 tablet 0  . magic mouthwash SOLN 5 ml po qid swish and spit 200 mL 0  . norethindrone-ethinyl estradiol (JUNEL FE,GILDESS FE,LOESTRIN FE) 1-20 MG-MCG tablet Take 1 tablet by mouth daily. 1 Package 11  . VENTOLIN HFA 108 (90 Base) MCG/ACT inhaler INHALE 2 PUFFS INTO THE LUNGS EVERY 6 (SIX) HOURS AS NEEDED FOR WHEEZING OR SHORTNESS OF BREATH. 18 Inhaler 0  . [DISCONTINUED] Norethindrone Acetate-Ethinyl Estradiol (JUNEL,LOESTRIN,MICROGESTIN) 1.5-30 MG-MCG tablet Take 1 tablet by mouth daily. 1 Package 11   No current facility-administered medications on file prior to visit.    Review of Systems: She does have  complaint of hot flashes.  She had a normal TSH in February As per HPI- otherwise negative.   Physical Examination: Filed Vitals:   12/12/15 1015  BP: 98/65  Pulse: 81  Temp: 98.5 F (36.9 C)   Filed Vitals:   12/12/15 1015  Weight: 119 lb 3.2 oz (54.069 kg)   Body mass index is 21.8 kg/(m^2). Ideal Body Weight:    GEN: WDWN, NAD, Non-toxic, A & O x 3, normal weight, looks well HEENT: Atraumatic, Normocephalic. Neck supple. No masses, No LAD.  Bilateral TM wnl, oropharynx shows no exudate or tonsillar enlargement,. She does have several aphthous ulcers on the mucosa.  PEERL,EOMI.   Ears and Nose: No external deformity. CV: RRR, No M/G/R. No JVD. No thrill. No extra heart sounds. PULM: CTA B, no wheezes, crackles, rhonchi. No retractions. No resp. distress. No  accessory muscle use. EXTR: No c/c/e NEURO Normal gait.  PSYCH: Normally interactive. Conversant. Not depressed or anxious appearing.  Calm demeanor.  Scattered discrete nodules of keratosis pilaris on her bilateral arms    Assessment and Plan: Mouth sores - Plan: fluocinonide gel (LIDEX) 0.05 %, clotrimazole (MYCELEX) 10 MG troche  Keratosis pilaris  Treat wiht fluicinonide gel and also mycelex in case any element of thrush Recent B12 level normal Discussed treatment for KP with OTC products Asked her to let me know if not better in 1 weeks   Signed Lamar Blinks, MD

## 2015-12-12 NOTE — Patient Instructions (Signed)
Use the medicated losenges and also apply a small amount of the gel to any sores in your mouth as needed Let me know if your symptoms are not gone in one week- Sooner if worse.    For your arms, OTC products containing salicylic acid and/ or urea can be helpful to clear up your skin.

## 2015-12-17 ENCOUNTER — Encounter: Payer: Self-pay | Admitting: Gynecology

## 2015-12-17 ENCOUNTER — Ambulatory Visit (INDEPENDENT_AMBULATORY_CARE_PROVIDER_SITE_OTHER): Payer: No Typology Code available for payment source | Admitting: Gynecology

## 2015-12-17 VITALS — BP 110/70

## 2015-12-17 DIAGNOSIS — N911 Secondary amenorrhea: Secondary | ICD-10-CM

## 2015-12-17 LAB — TSH: TSH: 1.41 mIU/L

## 2015-12-17 LAB — PREGNANCY, URINE: Preg Test, Ur: NEGATIVE

## 2015-12-17 MED ORDER — MEDROXYPROGESTERONE ACETATE 10 MG PO TABS: ORAL_TABLET | ORAL | Status: DC

## 2015-12-17 NOTE — Patient Instructions (Signed)
Amenorrea secundaria  (Secondary Amenorrhea) La amenorrea secundaria es la detencin del flujo menstrual durante 3-6 meses en una mujer que previamente tena sus perodos. Hay muchas causas posibles: La mayora de las causas no son graves. Generalmente, al tratar el problema subyacente que causa la detencin de la [place], podr volver a tener sus perodos normales. CAUSAS  Algunas causas comunes de la falta de menstruacin son:  Desnutricin.  Bajo nivel de Dispensing optician (hipoglucemia).  Enfermedad poliqustica de los ovarios.  Estrs o miedos.  Gwendlyn Deutscher.  Desequilibrio hormonal.  Insuficiencia ovrica.  Medicamentos.  Obesidad extrema.  Fibrosis qustica.  Reduccin de peso drstica por cualquier causa.  Menopausia precoz.  Extirpacin de los ovarios o del tero.  Anticonceptivos.  Enfermedades.  Enfermedades de larga duracin (crnicas).  Sndrome de [place].  Problemas de tiroides.  Pldoras, parches o anillos vaginales para el control de la natalidad. FACTORES DE RIESGO Puede tener ms riesgo de amenorrea secundaria si:  Tiene una historia familiar de este problema.  Sufre un trastorno alimentario.  Realiza entrenamiento deportivo. DIAGNSTICO  Este diagnstico la realiza el mdico por medio de la historia clnica y el examen fsico. Incluir un examen plvico para verificar si hay problemas en los rganos reproductores. Debe descartarse la posibilidad de embarazo. Generalmente se indicarn diferentes anlisis de sangre para medir diferentes tipos de hormonas en el organismo. Le indicarn anlisis de [place]. Le harn algunos estudios especializados (ecografas, tomografa computada, resonancia magntica o histeroscopa) y tambin medirn su ndice de masa corporal [facility]). TRATAMIENTO  El tratamiento depende de la causa de la [place]. Si hay un trastorno de Youth worker, deber tratarse con la dieta y la terapia [place]. Los  trastornos crnicos pueden mejorar con el tratamiento de la enfermedad. La amenorrea puede corregirse con medicamentos, cambios en el estilo de vida o con [place]. Si la amenorrea no puede corregirse, algunas veces es posible crear una falsa menstruacin con medicamentos. INSTRUCCIONES PARA EL CUIDADO EN EL HOGAR  Consuma una dieta saludable.  Controle los problemas de [place].  Haga ejercicios con regularidad, pero no excesivamente.  Duerma lo suficiente.  Controle el estrs.  Observe si hay cambios en el ciclo menstrual. Mantenga un registro del momento en que ocurren los perodos. Anote la fecha de inicio de los perodos, cunto duran y si hay problemas. SOLICITE ATENCIN MDICA SI: Los sntomas no mejoran con Dispensing optician.   Esta informacin no tiene Marine scientist el consejo del mdico. Asegrese de hacerle al mdico cualquier pregunta que tenga.   Document Released: 02/15/2013 Document Revised: 07/06/2014 Elsevier Interactive Patient Education [employer].

## 2015-12-17 NOTE — Progress Notes (Signed)
   HPI: Patient is a 50 year old who presented to the office today stating she has not had a menstrual cycle since May 13. Review of her records indicated she does have history of oligomenorrhea in the past she was last seen the office in February this year.She is a G0 P0 with long-standing history of primary infertility. Patient had laparoscopy in another facility several years ago and she was diagnosed with endometriosis and occlusion of the left fallopian tube. Patient complains of some vasomotor symptoms at time. She stated her on her sisters and her mother recently menopause after the age of 72. She denied any nipple discharge, blurry vision is only occasional headaches. She has not been using any form of contraception. Patient's husband had had a stroke recently and she has not been eating well and thought that she lost possibly 12 pounds as a result of her stress. In February this year patient had a normal FSH, TSH, and prolactin. She had been on the oral contraceptive pill that only took them for 2 months ago she developed a rash so her last time that she was on the birth control pill was in April and her menstrual cycle on May 13 was spontaneous.   ROS: A ROS was performed and pertinent positives and negatives are included in the history.  GENERAL: No fevers or chills. HEENT: No change in vision, no earache, sore throat or sinus congestion. NECK: No pain or stiffness. CARDIOVASCULAR: No chest pain or pressure. No palpitations. PULMONARY: No shortness of breath, cough or wheeze. GASTROINTESTINAL: No abdominal pain, nausea, vomiting or diarrhea, melena or bright red blood per rectum. GENITOURINARY: No urinary frequency, urgency, hesitancy or dysuria. MUSCULOSKELETAL: No joint or muscle pain, no back pain, no recent trauma. DERMATOLOGIC: No rash, no itching, no lesions. ENDOCRINE: No polyuria, polydipsia, no heat or cold intolerance. No recent change in weight. HEMATOLOGICAL: No anemia or easy bruising or  bleeding. NEUROLOGIC: No headache, seizures, numbness, tingling or weakness. PSYCHIATRIC: No depression, no loss of interest in normal activity or change in sleep pattern.   PE: Weight 124 pounds back in February she was not weighed today. Blood pressure 110/70 Well developed well nourished female in above-mentioned complaint : Abdomen soft nontender no rebound or guarding Pelvic: Bartholin urethra Skene was within normal limits Vagina: No lesions or discharge Cervix: No lesions or discharge slight tender blood was noted on the ectocervix Uterus: Anteverted normal size shape and consistency Adnexa: No palpable mass or tenderness Rectal exam: Not done  Urine presented test was negative.   Assessment Plan: Secondary amenorrhea related stress? We'll recheck a TSH, prolactin, and FSH today. If negative she was provided with a prescription for Provera 10 mg to take 1 by mouth daily for 10 days of each month if she does not have a spontaneous menses providing that she does a home (test at home first. All the above instructions were provided Spanish.    Greater than 50% of time was spent in counseling and coordinating care of this patient.   Time of consultation: 15   Minutes.

## 2015-12-18 LAB — FOLLICLE STIMULATING HORMONE: FSH: 44.4 m[IU]/mL

## 2015-12-20 ENCOUNTER — Ambulatory Visit: Payer: No Typology Code available for payment source | Admitting: Physician Assistant

## 2016-08-12 ENCOUNTER — Ambulatory Visit (INDEPENDENT_AMBULATORY_CARE_PROVIDER_SITE_OTHER): Payer: 59 | Admitting: Gynecology

## 2016-08-12 ENCOUNTER — Encounter: Payer: Self-pay | Admitting: Gynecology

## 2016-08-12 VITALS — BP 110/78 | Ht 62.0 in | Wt 119.0 lb

## 2016-08-12 DIAGNOSIS — N951 Menopausal and female climacteric states: Secondary | ICD-10-CM

## 2016-08-12 DIAGNOSIS — R102 Pelvic and perineal pain: Secondary | ICD-10-CM | POA: Diagnosis not present

## 2016-08-12 DIAGNOSIS — E559 Vitamin D deficiency, unspecified: Secondary | ICD-10-CM

## 2016-08-12 DIAGNOSIS — Z01411 Encounter for gynecological examination (general) (routine) with abnormal findings: Secondary | ICD-10-CM

## 2016-08-12 LAB — CBC WITH DIFFERENTIAL/PLATELET
BASOS ABS: 0 {cells}/uL (ref 0–200)
Basophils Relative: 0 %
EOS PCT: 2 %
Eosinophils Absolute: 118 cells/uL (ref 15–500)
HCT: 38.1 % (ref 35.0–45.0)
Hemoglobin: 12.7 g/dL (ref 11.7–15.5)
Lymphocytes Relative: 34 %
Lymphs Abs: 2006 cells/uL (ref 850–3900)
MCH: 28.1 pg (ref 27.0–33.0)
MCHC: 33.3 g/dL (ref 32.0–36.0)
MCV: 84.3 fL (ref 80.0–100.0)
MONOS PCT: 5 %
MPV: 10 fL (ref 7.5–12.5)
Monocytes Absolute: 295 cells/uL (ref 200–950)
Neutro Abs: 3481 cells/uL (ref 1500–7800)
Neutrophils Relative %: 59 %
PLATELETS: 264 10*3/uL (ref 140–400)
RBC: 4.52 MIL/uL (ref 3.80–5.10)
RDW: 14.1 % (ref 11.0–15.0)
WBC: 5.9 10*3/uL (ref 3.8–10.8)

## 2016-08-12 LAB — COMPREHENSIVE METABOLIC PANEL
ALK PHOS: 61 U/L (ref 33–115)
ALT: 12 U/L (ref 6–29)
AST: 15 U/L (ref 10–35)
Albumin: 4 g/dL (ref 3.6–5.1)
BUN: 18 mg/dL (ref 7–25)
CHLORIDE: 106 mmol/L (ref 98–110)
CO2: 25 mmol/L (ref 20–31)
Calcium: 9.1 mg/dL (ref 8.6–10.2)
Creat: 0.72 mg/dL (ref 0.50–1.10)
GLUCOSE: 79 mg/dL (ref 65–99)
POTASSIUM: 3.8 mmol/L (ref 3.5–5.3)
Sodium: 140 mmol/L (ref 135–146)
Total Bilirubin: 0.4 mg/dL (ref 0.2–1.2)
Total Protein: 6.6 g/dL (ref 6.1–8.1)

## 2016-08-12 LAB — LIPID PANEL
Cholesterol: 210 mg/dL — ABNORMAL HIGH (ref <200)
HDL: 93 mg/dL (ref >50)
LDL CALC: 101 mg/dL — AB (ref <100)
Total CHOL/HDL Ratio: 2.3 Ratio (ref <5.0)
Triglycerides: 79 mg/dL (ref <150)
VLDL: 16 mg/dL (ref <30)

## 2016-08-12 LAB — TSH: TSH: 1.76 mIU/L

## 2016-08-12 NOTE — Progress Notes (Signed)
Chelsea Barrett [birth year] [id number]   History:    51 y.o.  for annual gyn exam with history of skipping periods. Her [place] was elevated at 44 last year she has very little vasomotor symptoms. Patient with past history of endometriosis. Complaints of dyspareunia and low pelvic discomfort at times. Patient with past history vitamin D deficiency. Patient with no past history of any abnormal Pap smears.  Past medical history,surgical history, family history and social history were all reviewed and documented in the EPIC chart.  Gynecologic History Patient's last menstrual period was 08/08/2016 (approximate). Contraception: none Last Pap: 2017. Results were: normal Last mammogram: 2017. Results were: normal  Obstetric History OB History  Gravida Para Term Preterm AB Living  0 0 0 0 0 0  SAB TAB Ectopic Multiple Live Births  0 0 0 0           ROS: A ROS was performed and pertinent positives and negatives are included in the history.  GENERAL: No fevers or chills. HEENT: No change in vision, no earache, sore throat or sinus congestion. NECK: No pain or stiffness. CARDIOVASCULAR: No chest pain or pressure. No palpitations. PULMONARY: No shortness of breath, cough or wheeze. GASTROINTESTINAL: No abdominal pain, nausea, vomiting or diarrhea, melena or bright red blood per rectum. GENITOURINARY: No urinary frequency, urgency, hesitancy or dysuria. MUSCULOSKELETAL: No joint or muscle pain, no back pain, no recent trauma. DERMATOLOGIC: No rash, no itching, no lesions. ENDOCRINE: No polyuria, polydipsia, no heat or cold intolerance. No recent change in weight. HEMATOLOGICAL: No anemia or easy bruising or bleeding. NEUROLOGIC: No headache, seizures, numbness, tingling or weakness. PSYCHIATRIC: No depression, no loss of interest in normal activity or change in sleep pattern.     Exam: chaperone present  BP 110/78   Ht 5\' 2"  (1.575 m)   Wt 119 lb (54 kg)   LMP 08/08/2016 (Approximate)   BMI  21.77 kg/m   Body mass index is 21.77 kg/m.  General appearance : Well developed well nourished female. No acute distress HEENT: Eyes: no retinal hemorrhage or exudates,  Neck supple, trachea midline, no carotid bruits, no thyroidmegaly Lungs: Clear to auscultation, no rhonchi or wheezes, or rib retractions  Heart: Regular rate and rhythm, no murmurs or gallops Breast:Examined in sitting and supine position were symmetrical in appearance, no palpable masses or tenderness,  no skin retraction, no nipple inversion, no nipple discharge, no skin discoloration, no axillary or supraclavicular lymphadenopathy Abdomen: no palpable masses or tenderness, no rebound or guarding Extremities: no edema or skin discoloration or tenderness  Pelvic:  Bartholin, Urethra, Skene Glands: Within normal limits             Vagina: No gross lesions or discharge, menstrual blood  Cervix: No gross lesions or discharge  Uterus  anteverted, normal size, shape and consistency, non-tender and mobile  Adnexa  Without masses or tenderness  Anus and perineum  normal   Rectovaginal  normal sphincter tone without palpated masses or tenderness             Hemoccult not indicated     Assessment/Plan:  51 y.o. female for annual exam will return back next week for an ultrasound due to the fact that she was tender and I could not get an adequate pelvic exam today. The following fasting blood work will be ordered today: Fasting lipid profile comprehensive metabolic panel, TSH, and CBC. Patient declined flu vaccine today. She was reminded to schedule her Mammogram. Pap smear not indicated  this year.   Terrance Mass MD, 9:40 AM 08/12/2016

## 2016-08-13 LAB — URINALYSIS W MICROSCOPIC + REFLEX CULTURE
BACTERIA UA: NONE SEEN [HPF]
BILIRUBIN URINE: NEGATIVE
CASTS: NONE SEEN [LPF]
CRYSTALS: NONE SEEN [HPF]
Glucose, UA: NEGATIVE
KETONES UR: NEGATIVE
Leukocytes, UA: NEGATIVE
Nitrite: NEGATIVE
Protein, ur: NEGATIVE
RBC / HPF: NONE SEEN RBC/HPF (ref <=2)
SPECIFIC GRAVITY, URINE: 1.019 (ref 1.001–1.035)
Squamous Epithelial / LPF: NONE SEEN [HPF] (ref <=5)
WBC, UA: NONE SEEN WBC/HPF (ref <=5)
Yeast: NONE SEEN [HPF]
pH: 5 (ref 5.0–8.0)

## 2016-08-13 LAB — VITAMIN D 25 HYDROXY (VIT D DEFICIENCY, FRACTURES): Vit D, 25-Hydroxy: 28 ng/mL — ABNORMAL LOW (ref 30–100)

## 2016-08-19 ENCOUNTER — Other Ambulatory Visit: Payer: Self-pay | Admitting: Gynecology

## 2016-08-19 ENCOUNTER — Encounter: Payer: Self-pay | Admitting: Gynecology

## 2016-08-19 ENCOUNTER — Ambulatory Visit (INDEPENDENT_AMBULATORY_CARE_PROVIDER_SITE_OTHER): Payer: 59 | Admitting: Gynecology

## 2016-08-19 ENCOUNTER — Ambulatory Visit (INDEPENDENT_AMBULATORY_CARE_PROVIDER_SITE_OTHER): Payer: 59

## 2016-08-19 VITALS — Ht 62.0 in | Wt 119.0 lb

## 2016-08-19 DIAGNOSIS — R938 Abnormal findings on diagnostic imaging of other specified body structures: Secondary | ICD-10-CM

## 2016-08-19 DIAGNOSIS — R102 Pelvic and perineal pain: Secondary | ICD-10-CM | POA: Diagnosis not present

## 2016-08-19 DIAGNOSIS — R9389 Abnormal findings on diagnostic imaging of other specified body structures: Secondary | ICD-10-CM

## 2016-08-19 DIAGNOSIS — N859 Noninflammatory disorder of uterus, unspecified: Secondary | ICD-10-CM | POA: Diagnosis not present

## 2016-08-19 DIAGNOSIS — N9489 Other specified conditions associated with female genital organs and menstrual cycle: Secondary | ICD-10-CM | POA: Diagnosis not present

## 2016-08-19 DIAGNOSIS — D251 Intramural leiomyoma of uterus: Secondary | ICD-10-CM

## 2016-08-19 DIAGNOSIS — N941 Unspecified dyspareunia: Secondary | ICD-10-CM

## 2016-08-19 DIAGNOSIS — E559 Vitamin D deficiency, unspecified: Secondary | ICD-10-CM | POA: Diagnosis not present

## 2016-08-19 DIAGNOSIS — N858 Other specified noninflammatory disorders of uterus: Secondary | ICD-10-CM

## 2016-08-19 NOTE — Progress Notes (Signed)
  Patient is a 51 year old who presented to the office today for her ultrasound. She was seen the office for annual exam on February 14 and had been complaining of skipping cycles at times. She is also having some dyspareunia and low pelvic discomfort and this is the reason for the ultrasound. She is also here to discuss results from last office visit.  Her vitamin D level was found to be low at 28, lipid profile indicated total cholesterol slightly elevated with a value of 210 and LDL at 101 the remainder the parameters were normal. Her CBC and conference metabolic panel and TSH were normal. She had a [place] in 2017 which was in the menopausal range with a value of 44.4. Her vasomotor symptoms and very minimal if any.  Ultrasound: Uterus measuring 9.0 x 5.1 x 4.1 cm endometrial stripe 17.5 mm (last menstrual period less than 5 days ago. 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 Right and left ovary were normal. There was a question a small echogenic focus in the uterus measured 15 x 12 mm with positive vascular flow.  Assessment/plan: #1 vitamin D deficiency patient will be prescribed vitamin D 50,000 units every weekly for 12 weeks and then return back to the office for vitamin D level checked. Once an levels return back to normal she will be taking vitamin D3 cholecalciferol 2000 units every daily. #2 as a result of the finding on the ultrasound of a possible endometrial polyp she will return back to the office in a few weeks for sonohysterogram to complete evaluation.

## 2016-08-19 NOTE — Patient Instructions (Addendum)
Fibromas uterinos  (Uterine Fibroids)  Los fibromas uterinos son masas (tumores) de tejido que pueden desarrollarse en el vientre (útero). También se los conoce como liomiomas. Este tipo de tumor no es canceroso (benigno) y no se disemina a otras partes del cuerpo fuera de la zona pélvica, la cual se encuentra entre los huesos de la cadera. En ocasiones, los fibromas pueden crecer en las trompas de [place], en el cuello del útero o en las estructuras de soporte (ligamentos) que rodean el útero.  Una mujer puede tener uno o más fibromas. Los fibromas pueden tener diferente tamaño y peso, y crecer en distintas partes del útero. Algunos pueden crecer hasta volverse bastante grandes. La mayoría no requiere tratamiento médico.  CAUSAS  Un fibroma puede desarrollarse cuando una única célula uterina continúa creciendo (se multiplica). La mayoría de las células del cuerpo humano tienen un mecanismo de control que impide que se multipliquen sin control.  SIGNOS Y SÍNTOMAS  Entre los síntomas se pueden incluir los siguientes:  · Hemorragias intensas durante la menstruación.  · Pérdidas de sangre o hemorragias entre los períodos.  · Dolor y opresión en la pelvis.  · Problemas de la vejiga, como necesidad de orinar con más frecuencia (polaquiuria) o necesidad imperiosa de orinar.  · Incapacidad para reproducir (infertilidad).  · Abortos espontáneos.  DIAGNÓSTICO  Los fibromas uterinos se diagnostican con un examen físico. El médico puede palpar los tumores grumosos durante un examen pélvico. Pueden realizarse ecografías y una resonancia magnética para determinar el tamaño y la ubicación de los fibromas, así como la cantidad.  TRATAMIENTO  El tratamiento puede incluir lo siguiente:  · Observación cautelosa. Esto requiere que el médico controle el fibroma para saber si crece o se achica. Siga las recomendaciones del médico respecto de la frecuencia con la que debe realizarse los controles.  · Medicamentos hormonales. Pueden  tomarse por vía oral o administrarse a través de un dispositivo intrauterino (DIU).  · Cirugía.  ? Extirpación de los fibromas (miomectomía) o del útero (histerectomía).  ? Suprimir la irrigación sanguínea a los fibromas (embolización de la arteria uterina).  Si los fibromas le traen problemas de fertilidad y tiene deseos de quedar embarazada, el médico puede recomendar su extirpación.  INSTRUCCIONES PARA EL CUIDADO EN EL HOGAR  · Concurra a todas las visitas de control como se lo haya indicado el médico. Esto es importante.  · Tome los medicamentos de venta libre y los recetados solamente como se lo haya indicado el médico.  ? Si le recetaron un tratamiento hormonal, tome los medicamentos hormonales exactamente como se lo indicaron.  · Consulte al médico sobre tomar comprimidos de hierro y aumentar la cantidad de verduras de hoja color verde oscuro en la dieta. Estas medidas pueden ayudar a incrementar los niveles de hierro en la sangre, que pueden verse afectados por las hemorragias menstruales intensas.  · Preste mucha atención a la menstruación e informe al médico si hay algún cambio, por ejemplo:  ? Aumento del flujo de sangre que le exige el uso de más compresas o tampones que los que utiliza normalmente cada mes.  ? Un cambio en la cantidad de días que le dura la menstruación cada mes.  ? Un cambio en los síntomas asociados con la menstruación, como cólicos abdominales o dolor de espalda.    SOLICITE ATENCIÓN MÉDICA SI:  · Tiene dolor pélvico, dolor de espalda o cólicos abdominales que los medicamentos no pueden controlar.  · Observa un aumento del sangrado entre y   El dolor plvico aumenta repentinamente. Esta informacin no tiene Marine scientist el consejo del mdico. Asegrese de hacerle al  mdico cualquier pregunta que tenga. Document Released: 06/15/2005 Document Revised: 10/07/2015 Document Reviewed: 12/12/2013 Elsevier Interactive Patient Education  2017 [place] de vitaminaD (Vitamin D Deficiency) La deficiencia de vitaminaD ocurre cuando el organismo no tiene suficiente vitaminaD. La vitaminaD es importante por lo siguiente:  Ayuda al organismo a usar otros minerales que necesita.  Ayuda a [employer] fuertes y sanos.  Puede ayudar a Actor.  Ayuda al buen funcionamiento del corazn y de otros msculos. Para obtener vitaminaD, puede hacer lo siguiente:  Consuma alimentos que contengan vitaminaD.  Consuma o beba leche u otros alimentos con agregado de vitaminaD.  Tome un suplemento de vitaminaD.  Expngase al sol. La ingesta insuficiente de vitaminaD puede reblandecer los huesos. Tambin puede causar otros problemas de [place]. [place] los medicamentos y los suplementos solamente como se lo haya indicado el mdico.  Consuma alimentos que contengan vitaminaD, entre ellos:  Productos lcteos, cereales o jugos con agregado de vitaminaD. Revise las etiquetas de los productos para saber si contienen vitaminaD.  Pescados grasos, como el salmn o la [place].  Huevos.  Ostras.  No use camas solares.  Mantenga un peso saludable. Baje de peso, si es necesario.  Concurra a todas las visitas de control como se lo haya indicado el mdico. Esto es importante. SOLICITE AYUDA SI:  Los sntomas no desaparecen.  Siente malestar estomacal (nuseas).  Vomita.  Defeca menos que lo habitual o tiene dificultades para defecar (estreimiento). Esta informacin no tiene Marine scientist el consejo del mdico. Asegrese de hacerle al mdico cualquier pregunta que tenga. Document Released: 06/04/2011 Document Revised: 03/06/2015 Document Reviewed: 10/31/2014 Elsevier Interactive Patient  Education  2017 [employer].

## 2016-08-20 ENCOUNTER — Other Ambulatory Visit: Payer: Self-pay | Admitting: Anesthesiology

## 2016-08-20 MED ORDER — VITAMIN D (ERGOCALCIFEROL) 1.25 MG (50000 UNIT) PO CAPS: 50000.0000 [IU] | ORAL_CAPSULE | ORAL | 0 refills | Status: DC

## 2016-09-15 ENCOUNTER — Other Ambulatory Visit: Payer: Self-pay | Admitting: Gynecology

## 2016-09-15 DIAGNOSIS — Z1231 Encounter for screening mammogram for malignant neoplasm of breast: Secondary | ICD-10-CM

## 2016-10-05 ENCOUNTER — Ambulatory Visit
Admission: RE | Admit: 2016-10-05 | Discharge: 2016-10-05 | Disposition: A | Discharge status: Home / Self Care | Payer: 59 | Source: Ambulatory Visit | Attending: Gynecology | Admitting: Gynecology

## 2016-10-05 DIAGNOSIS — Z1231 Encounter for screening mammogram for malignant neoplasm of breast: Secondary | ICD-10-CM | POA: Diagnosis not present

## 2016-10-19 DIAGNOSIS — H5213 Myopia, bilateral: Secondary | ICD-10-CM | POA: Diagnosis not present

## 2016-10-28 ENCOUNTER — Ambulatory Visit (INDEPENDENT_AMBULATORY_CARE_PROVIDER_SITE_OTHER): Payer: 59 | Admitting: Medical

## 2016-10-28 ENCOUNTER — Ambulatory Visit (HOSPITAL_BASED_OUTPATIENT_CLINIC_OR_DEPARTMENT_OTHER)
Admission: RE | Admit: 2016-10-28 | Discharge: 2016-10-28 | Disposition: A | Discharge status: Home / Self Care | Payer: 59 | Source: Ambulatory Visit | Attending: Medical | Admitting: Medical

## 2016-10-28 VITALS — BP 106/69 | HR 63 | Temp 98.3°F | Resp 16 | Ht 62.0 in | Wt 118.8 lb

## 2016-10-28 DIAGNOSIS — M545 Low back pain, unspecified: Secondary | ICD-10-CM

## 2016-10-28 DIAGNOSIS — M7711 Lateral epicondylitis, right elbow: Secondary | ICD-10-CM | POA: Diagnosis not present

## 2016-10-28 DIAGNOSIS — M25521 Pain in right elbow: Secondary | ICD-10-CM | POA: Diagnosis present

## 2016-10-28 MED ORDER — DICLOFENAC SODIUM 75 MG PO TBEC: 75.0000 mg | DELAYED_RELEASE_TABLET | Freq: Two times a day (BID) | ORAL | 0 refills | Status: DC

## 2016-10-28 NOTE — Progress Notes (Signed)
Subjective:    Patient ID: Chelsea Barrett, female    DOB: [birth year], 51 y.o.   MRN: [id number]  HPI  Rt lateral aspect elbow pain lateral aspect. Pain since new duty at work. Repetitve activity makes it worse.  Also has some lumbar back pain. Pt told in pt had herniated disk. She was told in past not do heavy lifting. With her work has constantly dorsiflex and plantar flex rt foot/ankle. Pt states hx of back pain years ago. But with new duty pain has gotten worse. Yesterday pain worse. Some mild heavy lifting but does not know exact weight she is lifting at work.  No loss of bladder function. No saddle anesthesia. No foot drop.  LMP- October 11, 2016.  Pt is using alleve. Yesterday if helped some.  Review of Systems  Constitutional: Negative for chills, fatigue and fever.  Respiratory: Negative for cough, chest tightness, shortness of breath and wheezing.   Cardiovascular: Negative for chest pain and palpitations.  Gastrointestinal: Negative for abdominal pain.  Musculoskeletal: Positive for back pain. Negative for myalgias and neck stiffness.       Rt elbow pain.  Skin: Negative for rash.  Neurological: Negative for dizziness, speech difficulty, weakness, light-headedness, numbness and headaches.  Hematological: Does not bruise/bleed easily.  Psychiatric/Behavioral: Negative for behavioral problems, confusion, dysphoric mood and hallucinations.   Past Medical History:  Diagnosis Date  . Chalazion of both eyes   . Endometriosis   . Lumbar herniated disc    L5  . Vitamin D deficiency      Social History   Social History  . Marital status: Married    Spouse name: N/A  . Number of children: N/A  . Years of education: N/A   Occupational History  . Not on file.   Social History Main Topics  . Smoking status: Never Smoker  . Smokeless tobacco: Never Used  . Alcohol use No  . Drug use: No  . Sexual activity: Not Currently     Comment: 1ST INTERCOURSE- 16, PARTNERS- 3      Other Topics Concern  . Not on file   Social History Narrative  . No narrative on file    Past Surgical History:  Procedure Laterality Date  . BREAST BIOPSY  2008   No malignancy  . DILATION AND CURETTAGE OF UTERUS  2005  . PELVIC LAPAROSCOPY      Family History  Problem Relation Age of Onset  . Prostate cancer Father 16    Deceased  . Lymphoma Mother 30    Neck-Deceased  . COPD Maternal Grandmother   . Healthy Brother     x5  . Healthy Sister     x8  . Breast cancer Neg Hx     No Known Allergies  Current Outpatient Prescriptions on File Prior to Visit  Medication Sig Dispense Refill  . Vitamin D, Ergocalciferol, (DRISDOL) 50000 units CAPS capsule Take 1 capsule (50,000 Units total) by mouth every 7 (seven) days. 12 capsule 0  . [DISCONTINUED] Norethindrone Acetate-Ethinyl Estradiol (JUNEL,LOESTRIN,MICROGESTIN) 1.5-30 MG-MCG tablet Take 1 tablet by mouth daily. 1 Package 11   No current facility-administered medications on file prior to visit.     BP 106/69 (BP Location: Left Arm, Patient Position: Sitting, Cuff Size: Normal)   Pulse 63   Temp 98.3 F (36.8 C) (Oral)   Resp 16   Ht 5\' 2"  (1.575 m)   Wt 118 lb 12.8 oz (53.9 kg)   LMP 10/01/2016 (Exact Date)  SpO2 100%   BMI 21.73 kg/m       Objective:   Physical Exam   General Appearance- Not in acute distress.    Chest and Lung Exam Auscultation: Breath sounds:-Normal. Clear even and unlabored. Adventitious sounds:- No Adventitious sounds.  Cardiovascular Auscultation:Rythm - Regular, rate and rythm. Heart Sounds -Normal heart sounds.  Abdomen Inspection:-Inspection Normal.  Palpation/Perucssion: Palpation and Percussion of the abdomen reveal- Non Tender, No Rebound tenderness, No rigidity(Guarding) and No Palpable abdominal masses.  Liver:-Normal.  Spleen:- Normal.   Back Mid lumbar spine tenderness to palpation. Pain on straight leg lift. Pain on lateral movements and  flexion/extension of the spine.  Lower ext neurologic  L5-S1 sensation intact bilaterally. Normal patellar reflexes bilaterally. No foot drop bilaterally.  Rt elbow- rt lateral epicondyle tenderness to palpation. Pain in lateral epicondyle on  flexion and extension of wrist. Pain in lateral epicondyle area on pronation and supination.    Assessment & Plan:  For lateral epicondylitis will get xray. Recommend tennis elbow brace. Use diclofenac for pain and inflammation. Will see how you do over next 5-7 days. If not better then refer to sports med.  For back pain would recommend lighter duty.   I will write work note stating former duty would be better for elbow and back pain.(as pt requested)  Follow up in 7-10 days or as needed  Anant Agard, Percell Miller, [employer]

## 2016-10-28 NOTE — Patient Instructions (Signed)
For lateral epicondylitis will get xray. Recommend tennis elbow brace. Use diclofenac for pain and inflammation. Will see how you do over next 5-7 days. If not better then refer to sports med.  For back pain would recommend lighter duty.   I will write work note stating former duty would be better for elbow and back pain.  Follow up in 7-10 days or as needed

## 2016-10-28 NOTE — Progress Notes (Signed)
Pre visit review using our clinic review tool, if applicable. No additional management support is needed unless otherwise documented below in the visit note. 

## 2016-11-11 ENCOUNTER — Encounter: Payer: Self-pay | Admitting: Gynecology

## 2016-11-16 ENCOUNTER — Other Ambulatory Visit: Payer: 59

## 2016-11-17 LAB — VITAMIN D 25 HYDROXY (VIT D DEFICIENCY, FRACTURES): Vit D, 25-Hydroxy: 49 ng/mL (ref 30–100)

## 2016-12-29 ENCOUNTER — Ambulatory Visit (INDEPENDENT_AMBULATORY_CARE_PROVIDER_SITE_OTHER): Payer: 59 | Admitting: Family Medicine

## 2016-12-29 ENCOUNTER — Encounter: Payer: Self-pay | Admitting: Family Medicine

## 2016-12-29 VITALS — BP 98/60 | HR 92 | Temp 98.5°F | Resp 16 | Ht 62.0 in | Wt 122.2 lb

## 2016-12-29 DIAGNOSIS — J020 Streptococcal pharyngitis: Secondary | ICD-10-CM | POA: Diagnosis not present

## 2016-12-29 LAB — POCT RAPID STREP A (OFFICE): RAPID STREP A SCREEN: POSITIVE — AB

## 2016-12-29 MED ORDER — AMOXICILLIN 500 MG PO CAPS: 500.0000 mg | ORAL_CAPSULE | Freq: Three times a day (TID) | ORAL | 0 refills | Status: DC

## 2016-12-29 NOTE — Patient Instructions (Addendum)
Mucinex/Guaifenasin twice daily x 10 days. Probiotic such as Culturelle, [employer], Digestive Advantage, Align   Pharyngitis Pharyngitis is redness, pain, and swelling (inflammation) of your pharynx. What are the causes? Pharyngitis is usually caused by infection. Most of the time, these infections are from viruses (viral) and are part of a cold. However, sometimes pharyngitis is caused by bacteria (bacterial). Pharyngitis can also be caused by allergies. Viral pharyngitis may be spread from person to person by coughing, sneezing, and personal items or utensils (cups, forks, spoons, toothbrushes). Bacterial pharyngitis may be spread from person to person by more intimate contact, such as kissing. What are the signs or symptoms? Symptoms of pharyngitis include:  Sore throat.  Tiredness (fatigue).  Low-grade fever.  Headache.  Joint pain and muscle aches.  Skin rashes.  Swollen lymph nodes.  Plaque-like film on throat or tonsils (often seen with bacterial pharyngitis).  How is this diagnosed? Your health care provider will ask you questions about your illness and your symptoms. Your medical history, along with a physical exam, is often all that is needed to diagnose pharyngitis. Sometimes, a rapid strep test is done. Other lab tests may also be done, depending on the suspected cause. How is this treated? Viral pharyngitis will usually get better in 3-4 days without the use of medicine. Bacterial pharyngitis is treated with medicines that kill germs (antibiotics). Follow these instructions at home:  Drink enough water and fluids to keep your urine clear or pale yellow.  Only take over-the-counter or prescription medicines as directed by your health care provider: ? If you are prescribed antibiotics, make sure you finish them even if you start to feel better. ? Do not take aspirin.  Get lots of rest.  Gargle with 8 oz of salt water ( tsp of salt per 1 qt of water) as  often as every 1-2 hours to soothe your throat.  Throat lozenges (if you are not at risk for choking) or sprays may be used to soothe your throat. Contact a health care provider if:  You have large, tender lumps in your neck.  You have a rash.  You cough up green, yellow-brown, or bloody spit. Get help right away if:  Your neck becomes stiff.  You drool or are unable to swallow liquids.  You vomit or are unable to keep medicines or liquids down.  You have severe pain that does not go away with the use of recommended medicines.  You have trouble breathing (not caused by a stuffy nose). This information is not intended to replace advice given to you by your health care provider. Make sure you discuss any questions you have with your health care provider. Document Released: 06/15/2005 Document Revised: 11/21/2015 Document Reviewed: 02/20/2013 Elsevier Interactive Patient Education  2017 [employer].

## 2016-12-29 NOTE — Progress Notes (Signed)
Patient ID: Chelsea Barrett, female   DOB: 01/16/1966, 51 y.o.   MRN: [id number]    Subjective:  I acted as a Education administrator for Dr. Charlett Blake.  Guerry Bruin, [place].   Patient ID: Chelsea Barrett, female    DOB: 1966-06-08, 51 y.o.   MRN: [id number]  Chief Complaint  Patient presents with  . Cough  . Sore Throat    Cough  This is a new problem. Episode onset: 8 days ago. Cough characteristics: sometimes productive yellow sputum. Associated symptoms include nasal congestion, postnasal drip, a sore throat and shortness of breath. Pertinent negatives include no chills, ear congestion, ear pain or headaches. Associated symptoms comments: Itchy ears. Treatments tried: dayquil and vit c.  Sore Throat   This is a new problem. Episode onset: 8 days ago. Associated symptoms include congestion, coughing and shortness of breath. Pertinent negatives include no ear discharge, ear pain, headaches or trouble swallowing. Treatments tried: dayquil and vitamin c.    Patient is in today for cough and sore throat for 8 days. No chest congestion, sob or palpitations. Does endorse cough, itchy ears.  Patient Care Team: Saguier, Iris Pert as PCP - General (Internal Medicine)   Past Medical History:  Diagnosis Date  . Chalazion of both eyes   . Endometriosis   . Lumbar herniated disc    L5  . Vitamin D deficiency     Past Surgical History:  Procedure Laterality Date  . BREAST BIOPSY  2008   No malignancy  . DILATION AND CURETTAGE OF UTERUS  2005  . PELVIC LAPAROSCOPY      Family History  Problem Relation Age of Onset  . Prostate cancer Father 57       Deceased  . Lymphoma Mother 30       Neck-Deceased  . COPD Maternal Grandmother   . Healthy Brother        x5  . Healthy Sister        x8  . Breast cancer Neg Hx     Social History   Social History  . Marital status: Married    Spouse name: N/A  . Number of children: N/A  . Years of education: N/A   Occupational History  . Not on file.   Social  History Main Topics  . Smoking status: Never Smoker  . Smokeless tobacco: Never Used  . Alcohol use No  . Drug use: No  . Sexual activity: Not Currently     Comment: 1ST INTERCOURSE- 16, PARTNERS- 3    Other Topics Concern  . Not on file   Social History Narrative  . No narrative on file    Outpatient Medications Prior to Visit  Medication Sig Dispense Refill  . diclofenac (VOLTAREN) 75 MG EC tablet Take 1 tablet (75 mg total) by mouth 2 (two) times daily. 30 tablet 0  . Vitamin D, Ergocalciferol, (DRISDOL) 50000 units CAPS capsule Take 1 capsule (50,000 Units total) by mouth every 7 (seven) days. 12 capsule 0   No facility-administered medications prior to visit.     No Known Allergies  Review of Systems  Constitutional: Negative for chills.  HENT: Positive for congestion, postnasal drip and sore throat. Negative for ear discharge, ear pain and trouble swallowing.   Respiratory: Positive for cough and shortness of breath.   Neurological: Negative for headaches.       Objective:    Physical Exam  Constitutional: She appears well-developed and well-nourished.  HENT:  Head: Normocephalic and atraumatic.  Right Ear:  Hearing, tympanic membrane, external ear and ear canal normal.  Left Ear: Hearing, tympanic membrane, external ear and ear canal normal.  Nose: Rhinorrhea present. Right sinus exhibits no maxillary sinus tenderness and no frontal sinus tenderness. Left sinus exhibits no maxillary sinus tenderness and no frontal sinus tenderness.  Mouth/Throat: Mucous membranes are not pale, not dry and not cyanotic. Posterior oropharyngeal erythema present. No oropharyngeal exudate or posterior oropharyngeal edema.  Oropharynx is mildly erythematous  Lymphadenopathy:    She has cervical adenopathy.  Skin: She is not diaphoretic.    BP 98/60 (BP Location: Right Arm, Cuff Size: Normal)   Pulse 92   Temp 98.5 F (36.9 C) (Oral)   Resp 16   Ht 5\' 2"  (1.575 m)   Wt 122 lb  3.2 oz (55.4 kg)   LMP 09/29/2016   SpO2 92%   BMI 22.35 kg/m  Wt Readings from Last 3 Encounters:  12/29/16 122 lb 3.2 oz (55.4 kg)  10/28/16 118 lb 12.8 oz (53.9 kg)  08/19/16 119 lb (54 kg)   BP Readings from Last 3 Encounters:  12/29/16 98/60  10/28/16 106/69  08/12/16 110/78      There is no immunization history on file for this patient.  Health Maintenance  Topic Date Due  . HIV Screening  02/26/1983  . TETANUS/TDAP  02/26/1987  . INFLUENZA VACCINE  01/27/2017  . PAP SMEAR  08/11/2018    Lab Results  Component Value Date   WBC 5.9 08/12/2016   HGB 12.7 08/12/2016   HCT 38.1 08/12/2016   PLT 264 08/12/2016   GLUCOSE 79 08/12/2016   CHOL 210 (H) 08/12/2016   TRIG 79 08/12/2016   HDL 93 08/12/2016   LDLCALC 101 (H) 08/12/2016   ALT 12 08/12/2016   AST 15 08/12/2016   NA 140 08/12/2016   K 3.8 08/12/2016   CL 106 08/12/2016   CREATININE 0.72 08/12/2016   BUN 18 08/12/2016   CO2 25 08/12/2016   TSH 1.76 08/12/2016    Lab Results  Component Value Date   TSH 1.76 08/12/2016   Lab Results  Component Value Date   WBC 5.9 08/12/2016   HGB 12.7 08/12/2016   HCT 38.1 08/12/2016   MCV 84.3 08/12/2016   PLT 264 08/12/2016   Lab Results  Component Value Date   NA 140 08/12/2016   K 3.8 08/12/2016   CO2 25 08/12/2016   GLUCOSE 79 08/12/2016   BUN 18 08/12/2016   CREATININE 0.72 08/12/2016   BILITOT 0.4 08/12/2016   ALKPHOS 61 08/12/2016   AST 15 08/12/2016   ALT 12 08/12/2016   PROT 6.6 08/12/2016   ALBUMIN 4.0 08/12/2016   CALCIUM 9.1 08/12/2016   Lab Results  Component Value Date   CHOL 210 (H) 08/12/2016   Lab Results  Component Value Date   HDL 93 08/12/2016   Lab Results  Component Value Date   LDLCALC 101 (H) 08/12/2016   Lab Results  Component Value Date   TRIG 79 08/12/2016   Lab Results  Component Value Date   CHOLHDL 2.3 08/12/2016   No results found for: HGBA1C       Assessment & Plan:   Problem List Items  Addressed This Visit    Strep pharyngitis - Primary    Amoxcillin and probiotics started. Increase rest and fluids.       Relevant Orders   POCT rapid strep A (Completed)      I have discontinued [name]'s Vitamin D (Ergocalciferol)  and diclofenac. I am also having her start on amoxicillin.  Meds ordered this encounter  Medications  . amoxicillin (AMOXIL) 500 MG capsule    Sig: Take 1 capsule (500 mg total) by mouth 3 (three) times daily.    Dispense:  30 capsule    Refill:  0    CMA served as scribe during this visit. History, Physical and Plan performed by medical provider. Documentation and orders reviewed and attested to.  Penni Homans, MD

## 2016-12-30 DIAGNOSIS — J020 Streptococcal pharyngitis: Secondary | ICD-10-CM | POA: Insufficient documentation

## 2016-12-30 NOTE — Assessment & Plan Note (Signed)
Amoxcillin and probiotics started. Increase rest and fluids.

## 2017-02-25 ENCOUNTER — Encounter: Payer: Self-pay | Admitting: Obstetrics & Gynecology

## 2017-02-25 ENCOUNTER — Ambulatory Visit (INDEPENDENT_AMBULATORY_CARE_PROVIDER_SITE_OTHER): Payer: 59 | Admitting: Obstetrics & Gynecology

## 2017-02-25 VITALS — BP 112/78

## 2017-02-25 DIAGNOSIS — R938 Abnormal findings on diagnostic imaging of other specified body structures: Secondary | ICD-10-CM

## 2017-02-25 DIAGNOSIS — Z113 Encounter for screening for infections with a predominantly sexual mode of transmission: Secondary | ICD-10-CM | POA: Diagnosis not present

## 2017-02-25 DIAGNOSIS — N914 Secondary oligomenorrhea: Secondary | ICD-10-CM | POA: Diagnosis not present

## 2017-02-25 DIAGNOSIS — R3915 Urgency of urination: Secondary | ICD-10-CM | POA: Diagnosis not present

## 2017-02-25 DIAGNOSIS — R102 Pelvic and perineal pain: Secondary | ICD-10-CM

## 2017-02-25 DIAGNOSIS — R350 Frequency of micturition: Secondary | ICD-10-CM

## 2017-02-25 DIAGNOSIS — R9389 Abnormal findings on diagnostic imaging of other specified body structures: Secondary | ICD-10-CM

## 2017-02-25 LAB — URINALYSIS W MICROSCOPIC + REFLEX CULTURE
Bacteria, UA: NONE SEEN [HPF]
Bilirubin Urine: NEGATIVE
CASTS: NONE SEEN [LPF]
CRYSTALS: NONE SEEN [HPF]
Glucose, UA: NEGATIVE
Hgb urine dipstick: NEGATIVE
Ketones, ur: NEGATIVE
Leukocytes, UA: NEGATIVE
NITRITE: NEGATIVE
Protein, ur: NEGATIVE
RBC / HPF: NONE SEEN RBC/HPF (ref <=2)
SPECIFIC GRAVITY, URINE: 1.015 (ref 1.001–1.035)
WBC, UA: NONE SEEN WBC/HPF (ref <=5)
YEAST: NONE SEEN [HPF]
pH: 7 (ref 5.0–8.0)

## 2017-02-25 NOTE — Progress Notes (Signed)
    Chelsea Barrett 1966-02-08 [id number]        51 y.o.  G0 Married  RP:  Rt Pelvic pain/Bilateral Breast tenderness/Oligomenorrhea  HPI:  LMP 02/19/2017.  No menses x 3 months before LMP.  Chelsea Barrett 46.4 in 11/2015.  Rt pelvic pain/discomfort x 2 weeks, on-off.  Bilateral breast tenderness, feels congested x 1 month.  C/O urinary urgency and frequency, no burning with miction.  No fever.    Past medical history,surgical history, problem list, medications, allergies, family history and social history were all reviewed and documented in the EPIC chart.  Directed ROS with pertinent positives and negatives documented in the history of present illness/assessment and plan.  Exam:  Vitals:   02/25/17 1505  BP: 112/78   General appearance:  Normal  Abdo:  Soft, NT, no mass felt.  Gyn exam:  Vulva normal                     Bimanual exam:  Uterus AV normal volume, mobile, NT                                                 Lt Adnexa normal, no mass, NT.  Rt Adnexa No mass, mildly tender  Pelvic US 08/19/2016: Ultrasound: Uterus measuring 9.0 x 5.1 x 4.1 cm endometrial stripe 17.5 mm (last menstrual period less than 5 days ago. 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 Right and left ovary were normal. There was a question a small echogenic focus in the uterus measured 15 x 12 mm with positive vascular flow.  Assessment/Plan:  51 y.o. G0  1. Pelvic pain in female Rt pelvic pain, r/o Ovarian cyst/mass.  F/U Pelvic US. - US Transvaginal Non-OB; Future  2. Frequency of urination R/O UTI.  U/A negative. - Urinalysis with Culture Reflex  3. Urgency of urination As above - Urinalysis with Culture Reflex  4. Thickened endometrium Per Pelvic US, possible IU lesion/Thickened endometrium with focal lesion.  Sonohysto recommended, but not done at this point.  F/U Sonohysto. - [place] Sonohysterogram; Future  5. Secondary oligomenorrhea Probably  perimenopausal.  Would explain symptoms.  Will decide best way to manage at f/u with all results obtained. [facility]  Counseling on above issues >50% x 25 minutes.  Princess Bruins MD, 3:31 PM 02/25/2017

## 2017-02-26 LAB — GC/CHLAMYDIA PROBE AMP
CT PROBE, AMP APTIMA: NOT DETECTED
GC PROBE AMP APTIMA: NOT DETECTED

## 2017-02-26 LAB — FOLLICLE STIMULATING HORMONE: FSH: 8.1 m[IU]/mL

## 2017-03-01 NOTE — Patient Instructions (Signed)
1. Pelvic pain in female Rt pelvic pain, r/o Ovarian cyst/mass.  F/U Pelvic US. - US Transvaginal Non-OB; Future  2. Frequency of urination R/O UTI.  U/A negative. - Urinalysis with Culture Reflex  3. Urgency of urination As above - Urinalysis with Culture Reflex  4. Thickened endometrium Per Pelvic US, possible IU lesion/Thickened endometrium with focal lesion.  Sonohysto recommended, but not done at this point.  F/U Sonohysto. - [place] Sonohysterogram; Future  5. Secondary oligomenorrhea Probably perimenopausal.  Would explain symptoms.  Will decide best way to manage at f/u with all results obtained. - [place], it was a pleasure to meet you today!  See you soon for the Pelvic US/Sonohysterogram.

## 2017-03-10 ENCOUNTER — Other Ambulatory Visit: Payer: Self-pay | Admitting: Obstetrics & Gynecology

## 2017-03-10 DIAGNOSIS — R9389 Abnormal findings on diagnostic imaging of other specified body structures: Secondary | ICD-10-CM

## 2017-03-10 DIAGNOSIS — R102 Pelvic and perineal pain: Secondary | ICD-10-CM

## 2017-03-24 ENCOUNTER — Ambulatory Visit: Payer: 59 | Admitting: Obstetrics & Gynecology

## 2017-03-24 ENCOUNTER — Other Ambulatory Visit: Payer: 59

## 2017-05-26 ENCOUNTER — Ambulatory Visit: Payer: 59 | Admitting: Obstetrics & Gynecology

## 2017-05-26 ENCOUNTER — Other Ambulatory Visit: Payer: 59

## 2017-06-24 ENCOUNTER — Other Ambulatory Visit: Payer: 59

## 2017-06-24 ENCOUNTER — Ambulatory Visit: Payer: 59 | Admitting: Obstetrics & Gynecology

## 2017-07-14 ENCOUNTER — Ambulatory Visit (HOSPITAL_COMMUNITY)
Admission: EM | Admit: 2017-07-14 | Discharge: 2017-07-14 | Disposition: A | Discharge status: Home / Self Care | Payer: 59 | Attending: Urgent Care | Admitting: Urgent Care

## 2017-07-14 ENCOUNTER — Encounter (HOSPITAL_COMMUNITY): Payer: Self-pay | Admitting: Emergency Medicine

## 2017-07-14 DIAGNOSIS — J069 Acute upper respiratory infection, unspecified: Secondary | ICD-10-CM | POA: Diagnosis not present

## 2017-07-14 DIAGNOSIS — R0981 Nasal congestion: Secondary | ICD-10-CM

## 2017-07-14 DIAGNOSIS — B9789 Other viral agents as the cause of diseases classified elsewhere: Secondary | ICD-10-CM | POA: Diagnosis not present

## 2017-07-14 DIAGNOSIS — J029 Acute pharyngitis, unspecified: Secondary | ICD-10-CM

## 2017-07-14 MED ORDER — HYDROCODONE-HOMATROPINE 5-1.5 MG/5ML PO SYRP: 5.0000 mL | ORAL_SOLUTION | Freq: Every evening | ORAL | 0 refills | Status: DC | PRN

## 2017-07-14 MED ORDER — BENZONATATE 100 MG PO CAPS: 100.0000 mg | ORAL_CAPSULE | Freq: Three times a day (TID) | ORAL | 0 refills | Status: DC | PRN

## 2017-07-14 MED ORDER — ALBUTEROL SULFATE HFA 108 (90 BASE) MCG/ACT IN AERS: 1.0000 | INHALATION_SPRAY | Freq: Four times a day (QID) | RESPIRATORY_TRACT | 0 refills | Status: DC | PRN

## 2017-07-14 MED ORDER — PSEUDOEPHEDRINE HCL ER 120 MG PO TB12: 120.0000 mg | ORAL_TABLET | Freq: Two times a day (BID) | ORAL | 3 refills | Status: DC

## 2017-07-14 NOTE — Discharge Instructions (Signed)
Para el dolor de garganta intente usar un t de miel. Use 3 cucharaditas de miel con jugo exprimido de medio limn. Coloque las piezas de jengibre afeitadas en 1/2 - 1 taza de agua y caliente sobre la estufa. Luego mezcle los ingredientes y repita cada 4 horas.  

## 2017-07-14 NOTE — ED Provider Notes (Signed)
  MRN: [id number] DOB: 02-13-1966  Subjective:   Chelsea Barrett is a 51 y.o. female presenting for 2 week history of persistent productive cough that elicits shob, back pain. Has also had nasal congestion, sinus pain (now resolved), sore throat that started 2 days ago, fatigue, chills. Has tried otc medications with minimal relief. Patient has not rested, continues to work, works in a cold environment. Denies sinus pain, ear pain, chest pain, n/v, abdominal pain, rashes. Denies history of asthma. Denies smoking cigarettes. Patient did not get her flu shot this season.  Chelsea Barrett has No Known Allergies.  Chelsea Barrett  has a past medical history of Chalazion of both eyes, Endometriosis, Lumbar herniated disc, and Vitamin D deficiency. Also  has a past surgical history that includes Breast biopsy (2008); Dilation and curettage of uterus (2005); and Pelvic laparoscopy. Her family history includes COPD in her maternal grandmother; Healthy in her brother and sister; Lymphoma (age of onset: 103) in her mother; Prostate cancer (age of onset: 73) in her father.   Objective:   Vitals: BP 105/61 (BP Location: Left Arm)   Pulse 75   Temp 98.6 F (37 C) (Oral)   Resp 18   LMP 06/12/2017   SpO2 99%   Physical Exam  Constitutional: She is oriented to person, place, and time. She appears well-developed and well-nourished.  HENT:  TM's intact bilaterally, no effusions or erythema. Nasal turbinates pink, dry, nasal passages minimally patent. No sinus tenderness. Oropharynx with moderate post-nasal drainage but no exudates, mucous membranes moist.   Eyes: Right eye exhibits no discharge. Left eye exhibits no discharge.  Neck: Normal range of motion. Neck supple.  Cardiovascular: Normal rate, regular rhythm and intact distal pulses. Exam reveals no gallop and no friction rub.  No murmur heard. Pulmonary/Chest: No respiratory distress. She has no wheezes. She has no rales.  Lymphadenopathy:    She has no cervical  adenopathy.  Neurological: She is alert and oriented to person, place, and time.  Skin: Skin is warm and dry.  Psychiatric: She has a normal mood and affect.   Assessment and Plan :   Viral URI with cough  Nasal congestion  Sore throat  Will manage supportively for viral illness given physical exam findings, lack of rest. Work note provided. Return-to-clinic precautions discussed, patient verbalized understanding.     Jaynee Eagles, [place] 07/14/17 1439

## 2017-07-14 NOTE — ED Triage Notes (Signed)
PT C/O: cold sx onset 15 days associated w/sore throat, back pain, prod cough, SOB, nasal congestion, chills  DENIES: fevers  TAKING MEDS: OTC cold meds  A&O x4... NAD... Ambulatory

## 2017-08-13 ENCOUNTER — Encounter: Payer: 59 | Admitting: Obstetrics & Gynecology

## 2017-08-27 ENCOUNTER — Ambulatory Visit: Payer: 59 | Admitting: Medical

## 2017-08-27 ENCOUNTER — Ambulatory Visit (HOSPITAL_BASED_OUTPATIENT_CLINIC_OR_DEPARTMENT_OTHER)
Admission: RE | Admit: 2017-08-27 | Discharge: 2017-08-27 | Disposition: A | Discharge status: Home / Self Care | Payer: 59 | Source: Ambulatory Visit | Attending: Medical | Admitting: Medical

## 2017-08-27 ENCOUNTER — Encounter: Payer: Self-pay | Admitting: Medical

## 2017-08-27 VITALS — BP 120/73 | HR 83 | Temp 97.9°F | Resp 16 | Ht 62.0 in | Wt 126.2 lb

## 2017-08-27 DIAGNOSIS — J40 Bronchitis, not specified as acute or chronic: Secondary | ICD-10-CM | POA: Diagnosis not present

## 2017-08-27 DIAGNOSIS — R05 Cough: Secondary | ICD-10-CM | POA: Diagnosis not present

## 2017-08-27 DIAGNOSIS — R059 Cough, unspecified: Secondary | ICD-10-CM

## 2017-08-27 DIAGNOSIS — J0110 Acute frontal sinusitis, unspecified: Secondary | ICD-10-CM | POA: Diagnosis not present

## 2017-08-27 DIAGNOSIS — R062 Wheezing: Secondary | ICD-10-CM

## 2017-08-27 MED ORDER — FLUTICASONE PROPIONATE 50 MCG/ACT NA SUSP: 2.0000 | Freq: Every day | NASAL | 1 refills | Status: DC

## 2017-08-27 MED ORDER — ALBUTEROL SULFATE HFA 108 (90 BASE) MCG/ACT IN AERS: 1.0000 | INHALATION_SPRAY | Freq: Four times a day (QID) | RESPIRATORY_TRACT | 0 refills | Status: DC | PRN

## 2017-08-27 MED ORDER — AZITHROMYCIN 250 MG PO TABS: ORAL_TABLET | ORAL | 0 refills | Status: DC

## 2017-08-27 MED ORDER — HYDROCODONE-HOMATROPINE 5-1.5 MG/5ML PO SYRP: 5.0000 mL | ORAL_SOLUTION | Freq: Three times a day (TID) | ORAL | 0 refills | Status: DC | PRN

## 2017-08-27 MED ORDER — PREDNISONE 10 MG PO TABS: ORAL_TABLET | ORAL | 0 refills | Status: DC

## 2017-08-27 NOTE — Patient Instructions (Addendum)
You appear to have bronchitis and sinusitis. Rest hydrate and tylenol for fever. I am prescribing cough medicine hycodan, and azithromycin antibiotic. For your nasal congestion flonase.  For wheezing rx 5 day taper prednisone. Use albuterol 2 puffs if needed every 4-6 hours sob or wheezing.  cxr today  Follow up in 7-10 days or as needed

## 2017-08-27 NOTE — Progress Notes (Signed)
   Subjective:    Patient ID: Chelsea Barrett, female    DOB: [birth year], 52 y.o.   MRN: [id number]  HPI   Pt in for some nasal congestion, cough and st for one week. Occasional sneezing.  Pt states cough is constant(both day and night) and she has some wheezing over last 2 days. Describes moderate wheezing.  Some productive cough as well. No fever, no chills and no bodyaches.  Pt had similar illness December, January and again in February.   LMP- premenopausal    Review of Systems  Constitutional: Negative for chills, fatigue and fever.  HENT: Positive for congestion, sinus pressure and sinus pain.   Respiratory: Positive for cough and wheezing. Negative for chest tightness and shortness of breath.   Cardiovascular: Negative for chest pain and palpitations.  Gastrointestinal: Negative for abdominal pain.  Musculoskeletal: Negative for back pain and myalgias.  Skin: Negative for rash.  Neurological: Negative for facial asymmetry and headaches.  Hematological: Negative for adenopathy. Does not bruise/bleed easily.  Psychiatric/Behavioral: Negative for behavioral problems, confusion and dysphoric mood. The patient is not nervous/anxious.        Objective:   Physical Exam  General  Mental Status - Alert. General Appearance - Well groomed. Not in acute distress.  Skin Rashes- No Rashes.  HEENT Head- Normal. Ear Auditory Canal - Left- Normal. Right - Normal.Tympanic Membrane- Left- Normal. Right- Normal. Eye Sclera/Conjunctiva- Left- Normal. Right- Normal. Nose & Sinuses Nasal Mucosa- Left-  Boggy and Congested. Right-  Boggy and  Congested.Bilateral  No maxillary but  frontal sinus pressure. Mouth & Throat Lips: Upper Lip- Normal: no dryness, cracking, pallor, cyanosis, or vesicular eruption. Lower Lip-Normal: no dryness, cracking, pallor, cyanosis or vesicular eruption. Buccal Mucosa- Bilateral- No Aphthous ulcers. Oropharynx- No Discharge or Erythema. Tonsils:  Characteristics- Bilateral- No Erythema or Congestion. Size/Enlargement- Bilateral- No enlargement. Discharge- bilateral-None.  Neck Neck- Supple. No Masses.   Chest and Lung Exam Auscultation: Breath Sounds:-Clear even and unlabored.  Cardiovascular Auscultation:Rythm- Regular, rate and rhythm. Murmurs & Other Heart Sounds:Ausculatation of the heart reveal- No Murmurs.  Lymphatic Head & Neck General Head & Neck Lymphatics: Bilateral: Description- No Localized lymphadenopathy.       Assessment & Plan:   You appear to have bronchitis and sinusitis. Rest hydrate and tylenol for fever. I am prescribing cough medicine hycodan, and azithromycin antibiotic. For your nasal congestion flonase.  For wheezing rx 5 day taper prednisone. Use albuterol 2 puffs if needed every 4-6 hours sob or wheezing.  cxr today  Follow up in 7-10 days or as needed  [employer], PA-C

## 2017-08-30 ENCOUNTER — Ambulatory Visit: Payer: 59 | Admitting: Medical

## 2017-08-30 ENCOUNTER — Telehealth: Payer: Self-pay

## 2017-08-30 ENCOUNTER — Encounter: Payer: Self-pay | Admitting: Medical

## 2017-08-30 VITALS — BP 104/66 | HR 78 | Temp 98.0°F | Resp 16 | Ht 62.0 in | Wt 124.4 lb

## 2017-08-30 DIAGNOSIS — R062 Wheezing: Secondary | ICD-10-CM | POA: Diagnosis not present

## 2017-08-30 DIAGNOSIS — R059 Cough, unspecified: Secondary | ICD-10-CM

## 2017-08-30 DIAGNOSIS — R05 Cough: Secondary | ICD-10-CM | POA: Diagnosis not present

## 2017-08-30 DIAGNOSIS — J40 Bronchitis, not specified as acute or chronic: Secondary | ICD-10-CM

## 2017-08-30 MED ORDER — PREDNISONE 10 MG PO TABS: ORAL_TABLET | ORAL | 0 refills | Status: DC

## 2017-08-30 MED ORDER — HYDROCODONE-HOMATROPINE 5-1.5 MG/5ML PO SYRP: 5.0000 mL | ORAL_SOLUTION | Freq: Three times a day (TID) | ORAL | 0 refills | Status: DC | PRN

## 2017-08-30 MED ORDER — ALBUTEROL SULFATE (2.5 MG/3ML) 0.083% IN NEBU: 2.5000 mg | INHALATION_SOLUTION | Freq: Four times a day (QID) | RESPIRATORY_TRACT | 1 refills | Status: DC | PRN

## 2017-08-30 NOTE — Patient Instructions (Addendum)
For your severe cough and wheezing recently, I am sending albuterol solution to your pharmacy.  You had the machine at home and use neb treatment every 6 hours if needed.  Also I did send in taper dose of prednisone to your pharmacy.  Please start that today.  For your severe cough, I am prescribing Hycodan.  I have placed a referral to pulmonologist since your symptoms have been recurrent on a monthly basis since December.  I expect that your cough will be somewhat improved with cough medicine and prednisone.  But please give me an update by tomorrow.  If still persisting and unrelenting cough then would asked staff to call pulmonologist office and see if they can see you sooner.  Chest x-ray was negative.  However I do think that the azithromycin that was a good idea.  Other antibiotic not indicated presently.  Follow-up in 7 days or as needed.

## 2017-08-30 NOTE — Progress Notes (Signed)
Subjective:    Patient ID: Chelsea Barrett, female    DOB: 21-Oct-1965, 52 y.o.   MRN: [id number]  HPI  Pt in with severe cough.  I did print the prescription hyocdan. Pt states she did not get the rx.(I would typically always give the rx if I printed as recorded in epic).   This is the 3rd time with the illness. This event is the worst in terms of cough. Please see my last note.  Patient has been coughing all the time of her husband.  Pt when she borrowed nebulizer from a friend. She does not have solution. Seemed to help this weekend.  Pt thinks maybe dust exposure and can smell some chemicals at work. Pt wears mask and she feels like not protecting her airways adequately.  Pt also did not use the print prescription of prednisone.  He did fill the  nasal spray and antibiotic.   Review of Systems  Constitutional: Negative for chills, fatigue and fever.  HENT: Negative for congestion, ear pain, facial swelling and nosebleeds.   Respiratory: Positive for cough. Negative for chest tightness, shortness of breath and wheezing.        Some wheezing over the weekend but did respond to neb treatments.  Cardiovascular: Negative for chest pain and palpitations.  Gastrointestinal: Negative for abdominal pain.  Hematological: Negative for adenopathy. Does not bruise/bleed easily.  Psychiatric/Behavioral: Negative for behavioral problems and confusion.    Past Medical History:  Diagnosis Date  . Chalazion of both eyes   . Endometriosis   . Lumbar herniated disc    L5  . Vitamin D deficiency      Social History   Socioeconomic History  . Marital status: Married    Spouse name: Not on file  . Number of children: Not on file  . Years of education: Not on file  . Highest education level: Not on file  Social Needs  . Financial resource strain: Not on file  . Food insecurity - worry: Not on file  . Food insecurity - inability: Not on file  . Transportation needs - medical: Not on  file  . Transportation needs - non-medical: Not on file  Occupational History  . Not on file  Tobacco Use  . Smoking status: Never Smoker  . Smokeless tobacco: Never Used  Substance and Sexual Activity  . Alcohol use: No    Alcohol/week: 0.0 oz  . Drug use: No  . Sexual activity: Yes    Comment: 1ST INTERCOURSE- 56, PARTNERS- 3, married- 17 yrs   Other Topics Concern  . Not on file  Social History Narrative  . Not on file    Past Surgical History:  Procedure Laterality Date  . BREAST BIOPSY  2008   No malignancy  . DILATION AND CURETTAGE OF UTERUS  2005  . PELVIC LAPAROSCOPY      Family History  Problem Relation Age of Onset  . Prostate cancer Father 67       Deceased  . Lymphoma Mother 54       Neck-Deceased  . COPD Maternal Grandmother   . Healthy Brother        x5  . Healthy Sister        x8  . Breast cancer Neg Hx     No Known Allergies  Current Outpatient Medications on File Prior to Visit  Medication Sig Dispense Refill  . albuterol (PROVENTIL HFA;VENTOLIN HFA) 108 (90 Base) MCG/ACT inhaler Inhale 1-2 puffs into the lungs  every 6 (six) hours as needed for wheezing or shortness of breath. 1 Inhaler 0  . Ascorbic Acid (VITAMIN C) 100 MG tablet Take 100 mg by mouth daily.    Marland Kitchen azithromycin (ZITHROMAX) 250 MG tablet Take 2 tablets by mouth on day 1, followed by 1 tablet by mouth daily for 4 days. 6 tablet 0  . fluticasone (FLONASE) 50 MCG/ACT nasal spray Place 2 sprays into both nostrils daily. 16 g 1  . HYDROcodone-homatropine (HYCODAN) 5-1.5 MG/5ML syrup Take 5 mLs by mouth every 8 (eight) hours as needed. 100 mL 0  . Multiple Vitamin (MULTIVITAMIN) tablet Take 1 tablet by mouth daily.    . predniSONE (DELTASONE) 10 MG tablet 5 TAB PO DAY 1 4 TAB PO DAY 2 3 TAB PO DAY 3 2 TAB PO DAY 4 1 TAB PO DAY 5 15 tablet 0  . [DISCONTINUED] Norethindrone Acetate-Ethinyl Estradiol (JUNEL,LOESTRIN,MICROGESTIN) 1.5-30 MG-MCG tablet Take 1 tablet by mouth daily. 1 Package  11   No current facility-administered medications on file prior to visit.     BP 104/66   Pulse 78   Temp 98 F (36.7 C) (Oral)   Resp 16   Ht 5\' 2"  (1.575 m)   Wt 124 lb 6.4 oz (56.4 kg)   SpO2 100%   BMI 22.75 kg/m       Objective:   Physical Exam  General  Mental Status - Alert. General Appearance - Well groomed. Not in acute distress.  Skin Rashes- No Rashes.  Neck Neck- Supple. No Masses.   Chest and Lung Exam Auscultation: Breath Sounds:- even and unlabored.  Cardiovascular Auscultation:Rythm- Regular, rate and rhythm. Murmurs & Other Heart Sounds:Ausculatation of the heart reveal- No Murmurs.  Lymphatic Head & Neck General Head & Neck Lymphatics: Bilateral: Description- No Localized lymphadenopathy.       Assessment & Plan:  For your severe cough and wheezing recently, I am sending albuterol solution to your pharmacy.  You had the machine at home and use neb treatment every 6 hours if needed.  Also I did send in taper dose of prednisone to your pharmacy.  Please start that today.(Actually got my medical assistant to call that into the pharmacy.)  For your severe cough, I am prescribing Hycodan.(Sent in [place] electronically)  I have placed a referral to pulmonologist since your symptoms have been recurrent on a monthly basis since December.  I expect that your cough will be somewhat improved with cough medicine and prednisone.  But please give me an update by tomorrow.  If still persisting and unrelenting cough then would asked staff to call pulmonologist office and see if they can see you sooner.  Chest x-ray was negative.  However I do think that the azithromycin that was a good idea.  Other antibiotic not indicated presently.  Follow-up in 7 days or as needed.  Note per chart both Hycodan and prednisone were print rx's.  In that event I would have given the patient the prescriptions and have refill those.  However we called the pharmacy and  there is no record of refilling Hycodan.  Also asked Jasmine CMA to chec Narx report and Hycodan was not filled.  So I am unsure what happened.  She may have dropped the prescription leaving our office or may be I did not give her the prescription?  I did send Hycodan to her pharmacy today.  This time and send it electronically.

## 2017-08-30 NOTE — Telephone Encounter (Signed)
I saw pt today. Referred to pulmonlogist.

## 2017-08-30 NOTE — Telephone Encounter (Signed)
Copied from [place]. Topic: General - Other >> Aug 30, 2017  8:43 AM Yvette Rack wrote: Reason for CRM: patient calling stating that she would like to go to a specialist because she is no better she is having cough and vomiting and medicine isn't working she doesn't want anymore medicine just want to be referred to a specialist

## 2017-08-31 ENCOUNTER — Telehealth: Payer: Self-pay | Admitting: Medical

## 2017-08-31 ENCOUNTER — Encounter: Payer: Self-pay | Admitting: Medical

## 2017-08-31 NOTE — Telephone Encounter (Signed)
Done

## 2017-08-31 NOTE — Telephone Encounter (Signed)
I had asked you to pull Narx report on this pt. Please pull that and show me tomorrow.

## 2017-09-03 ENCOUNTER — Encounter: Payer: Self-pay | Admitting: Pulmonary Disease

## 2017-09-03 ENCOUNTER — Ambulatory Visit: Payer: 59 | Admitting: Pulmonary Disease

## 2017-09-03 DIAGNOSIS — J4541 Moderate persistent asthma with (acute) exacerbation: Secondary | ICD-10-CM

## 2017-09-03 DIAGNOSIS — J45909 Unspecified asthma, uncomplicated: Secondary | ICD-10-CM | POA: Insufficient documentation

## 2017-09-03 MED ORDER — BUDESONIDE-FORMOTEROL FUMARATE 160-4.5 MCG/ACT IN AERO: 2.0000 | INHALATION_SPRAY | Freq: Two times a day (BID) | RESPIRATORY_TRACT | 0 refills | Status: DC

## 2017-09-03 NOTE — Progress Notes (Signed)
Subjective:    Patient ID: Chelsea Barrett, female    DOB: [birth year], 52 y.o.   MRN: [id number]  HPI  Chief Complaint  Patient presents with  . Pulm Consult    Cough for the past 3 months. Semi-productive cough. Coughing spells that cause nausea. Increased SOB when walking around and after coughing spells. Patient also stated that she feels tired all of the time.      52 year old Hispanic woman, originally from [place], presents for evaluation of recurrent cough and wheezing for the last 3 months. She reports insidious onset around December initially with URI symptoms which then progressed to a chest cold and dry cough.  She had an ED visit 07/14/17-and was given albuterol, Sudafed, Tessalon and Hycodan cough syrup to use as needed, impression was viral bronchitis. She reports hoarseness of voice.  She improved initially and then got worse again with constant dry cough and wheezing.  She saw her PCP on 3/1, was noted to be wheezing, chest x-ray which I reviewed does not show any infiltrates or effusions, was given a Z-Pak and prednisone for 5 days symptoms worsen and then she had to go back on 3/4 this time was given albuterol nebs and Hycodan cough syrup.  She states that antibiotic caused her diarrhea, cough paroxysms have caused occasional vomiting and incontinence of urine.  She reports a tickle in her throat but no obvious postnasal drip or nasal congestion.  She denies obvious heartburn.  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  They are only given simple facemask to use she has been off work.  For about a week and symptoms are marginally improved     Past Medical History:  Diagnosis Date  . Chalazion of both eyes   . Endometriosis   . Lumbar herniated disc    L5  . Vitamin D deficiency      Past Surgical History:  Procedure Laterality Date  . BREAST BIOPSY  2008   No malignancy  . DILATION AND CURETTAGE OF UTERUS  2005  . PELVIC LAPAROSCOPY      No Known Allergies  Social History   Socioeconomic History  . Marital status: Married    Spouse name: Not on file  . Number of children: Not on file  . Years of education: Not on file  . Highest education level: Not on file  Social Needs  . Financial resource strain: Not on file  . Food insecurity - worry: Not on file  . Food insecurity - inability: Not on file  . Transportation needs - medical: Not on file  . Transportation needs - non-medical: Not on file  Occupational History  . Not on file  Tobacco Use  . Smoking status: Never Smoker  . Smokeless tobacco: Never Used  Substance and Sexual Activity  . Alcohol use: No    Alcohol/week: 0.0 oz  . Drug use: No  . Sexual activity: Yes    Comment: 1ST INTERCOURSE- 53, PARTNERS- 3, married- 17 yrs   Other Topics Concern  . Not on file  Social History Narrative  . Not on file   Family History  Problem Relation Age of Onset  . Prostate cancer Father 64       Deceased  . Lymphoma Mother 25  Neck-Deceased  . COPD Maternal Grandmother   . Healthy Brother        x5  . Healthy Sister        x8  . Breast cancer Neg Hx      Review of Systems  Positive for shortness of breath with activity, nonproductive cough, chest pain, irregular heartbeat, tooth problems, nasal congestion, denies weakness  Constitutional: negative for anorexia, fevers and sweats  Eyes: negative for irritation, redness and visual disturbance  Ears, nose, mouth, throat, and face: negative for earaches, epistaxis, nasal congestion and sore throat  Respiratory: negative for cough, dyspnea on exertion, sputum and wheezing  Cardiovascular: negative for chest pain, dyspnea, lower extremity edema, orthopnea, palpitations and syncope  Gastrointestinal: negative for abdominal pain,  constipation, diarrhea, melena, nausea and vomiting  Genitourinary:negative for dysuria, frequency and hematuria  Hematologic/lymphatic: negative for bleeding, easy bruising and lymphadenopathy  Musculoskeletal:negative for arthralgias, muscle weakness and stiff joints  Neurological: negative for coordination problems, gait problems, headaches and weakness  Endocrine: negative for diabetic symptoms including polydipsia, polyuria and weight loss     Objective:   Physical Exam  Gen. Pleasant, well-nourished, in no distress, normal affect ENT - no lesions, no post nasal drip Neck: No JVD, no thyromegaly, no carotid bruits Lungs: no use of accessory muscles, no dullness to percussion, clear without rales or rhonchi  Cardiovascular: Rhythm regular, heart sounds  normal, no murmurs or gallops, no peripheral edema Abdomen: soft and non-tender, no hepatosplenomegaly, BS normal. Musculoskeletal: No deformities, no cyanosis or clubbing Neuro:  alert, non focal       Assessment & Plan:

## 2017-09-03 NOTE — Patient Instructions (Addendum)
Letter for  Work  to avoid exposure to dust, chemicals or particulate matter  Sample of Symbicort 160-2 puffs twice daily, rinse mouth after use.  Use albuterol MDI 2 puffs only as needed for rescue if wheezing. Use  Hycodan cough syrup for excessive coughing as needed  If symptoms are no better in 4 weeks then we will do more testing

## 2017-09-03 NOTE — Assessment & Plan Note (Signed)
Letter for  Work  to avoid exposure to dust, chemicals or particulate matter-this would be most important intervention and if she stays away from exposure for 4 weeks since feels significantly improved, that would confirm diagnosis   Sample of Symbicort 160-2 puffs twice daily, rinse mouth after use.  Use albuterol MDI 2 puffs only as needed for rescue if wheezing. Use  Hycodan cough syrup for excessive coughing as needed  If symptoms are no better in 4 weeks then we will pursue spirometry and treat empirically for postnasal drip/GERD

## 2017-09-06 ENCOUNTER — Ambulatory Visit: Payer: 59 | Admitting: Obstetrics & Gynecology

## 2017-09-06 ENCOUNTER — Encounter: Payer: Self-pay | Admitting: Obstetrics & Gynecology

## 2017-09-06 VITALS — BP 120/78 | Ht 60.0 in | Wt 127.0 lb

## 2017-09-06 DIAGNOSIS — Z1151 Encounter for screening for human papillomavirus (HPV): Secondary | ICD-10-CM | POA: Diagnosis not present

## 2017-09-06 DIAGNOSIS — Z01419 Encounter for gynecological examination (general) (routine) without abnormal findings: Secondary | ICD-10-CM

## 2017-09-06 DIAGNOSIS — Z3009 Encounter for other general counseling and advice on contraception: Secondary | ICD-10-CM | POA: Diagnosis not present

## 2017-09-06 DIAGNOSIS — Z113 Encounter for screening for infections with a predominantly sexual mode of transmission: Secondary | ICD-10-CM | POA: Diagnosis not present

## 2017-09-06 DIAGNOSIS — N914 Secondary oligomenorrhea: Secondary | ICD-10-CM | POA: Diagnosis not present

## 2017-09-06 DIAGNOSIS — L292 Pruritus vulvae: Secondary | ICD-10-CM | POA: Diagnosis not present

## 2017-09-06 MED ORDER — CLOBETASOL PROPIONATE 0.05 % EX CREA: 1.0000 "application " | TOPICAL_CREAM | Freq: Every day | CUTANEOUS | 0 refills | Status: AC

## 2017-09-06 MED ORDER — MEDROXYPROGESTERONE ACETATE 5 MG PO TABS: 5.0000 mg | ORAL_TABLET | Freq: Every day | ORAL | 4 refills | Status: DC

## 2017-09-06 NOTE — Progress Notes (Signed)
Chelsea Barrett 1965-10-09 [id number]   History:    52 y.o. G0 Married  RP:  Established patient presenting for annual gyn exam   HPI: Oligomenorrhea every 1-4 months with LMP 05/09/2017.  C/O hot flushes and fatigue.  No pelvic pain.  Dryness and discomfort with IC.  No vaginal discharge.  Urine/BMs wnl.  Breasts wnl, except for a darker patch of skin on left lower breast.  This area on the left breast corresponds to an area of redness and itching which has now resolved.  BMI 24.80  Past medical history,surgical history, family history and social history were all reviewed and documented in the EPIC chart.  Gynecologic History Patient's last menstrual period was 05/09/2017. Contraception: none Last Pap: 07/2015. Results were: negative Last mammogram: 09/2016. Results were: Negative Bone Density: Never Colonoscopy: Never  Obstetric History OB History  Gravida Para Term Preterm AB Living  0 0 0 0 0 0  SAB TAB Ectopic Multiple Live Births  0 0 0 0           ROS: A ROS was performed and pertinent positives and negatives are included in the history.  GENERAL: No fevers or chills. HEENT: No change in vision, no earache, sore throat or sinus congestion. NECK: No pain or stiffness. CARDIOVASCULAR: No chest pain or pressure. No palpitations. PULMONARY: No shortness of breath, cough or wheeze. GASTROINTESTINAL: No abdominal pain, nausea, vomiting or diarrhea, melena or bright red blood per rectum. GENITOURINARY: No urinary frequency, urgency, hesitancy or dysuria. MUSCULOSKELETAL: No joint or muscle pain, no back pain, no recent trauma. DERMATOLOGIC: No rash, no itching, no lesions. ENDOCRINE: No polyuria, polydipsia, no heat or cold intolerance. No recent change in weight. HEMATOLOGICAL: No anemia or easy bruising or bleeding. NEUROLOGIC: No headache, seizures, numbness, tingling or weakness. PSYCHIATRIC: No depression, no loss of interest in normal activity or change in sleep pattern.      Exam:   BP 120/78   Ht 5' (1.524 m)   Wt 127 lb (57.6 kg)   LMP 05/09/2017   BMI 24.80 kg/m   Body mass index is 24.8 kg/m.  General appearance : Well developed well nourished female. No acute distress HEENT: Eyes: no retinal hemorrhage or exudates,  Neck supple, trachea midline, no carotid bruits, no thyroidmegaly Lungs: Clear to auscultation, no rhonchi or wheezes, or rib retractions  Heart: Regular rate and rhythm, no murmurs or gallops Breast:Examined in sitting and supine position were symmetrical in appearance, no palpable masses or tenderness,  no skin retraction, no nipple inversion, no nipple discharge, no skin discoloration, no axillary or supraclavicular lymphadenopathy.  2x3 cm increased pigmentation left lower breast. Abdomen: no palpable masses or tenderness, no rebound or guarding Extremities: no edema or skin discoloration or tenderness  Pelvic: Vulva: Normal, itching on left with mild erythema             Vagina: No gross lesions or discharge  Cervix: No gross lesions or discharge.  Pap/HR HPV/Gono-Chlam done  Uterus  AV, normal size, shape and consistency, non-tender and mobile  Adnexa  Without masses or tenderness  Anus: Normal  Ultrasound on 07/2016: Uterus measuring 9.0 x 5.1 x 4.1 cm endometrial stripe 17.5 mm (last menstrual period less than 5 days ago. 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 Right and left ovary were normal. There was a question a small echogenic focus in the uterus measured 15 x 12 mm with positive vascular flow.  Assessment/Plan:  52 y.o. female for annual exam   1. Encounter for routine gynecological examination with Papanicolaou smear of cervix Normal gynecologic exam except for mild left vulvitis.  Pap with high-risk HPV done today.  Breast exam normal.  Small area of increased pigmentation on the left breast corresponds to an area of healed inflammation.  Will repeat screening  mammogram in April 2019.  Health labs here, will follow up to complete the fasting part.  Will do screening colonoscopy at age 82. - CBC - Comprehensive metabolic panel; Future - TSH - Lipid panel; Future - VITAMIN D 25 Hydroxy (Vit-D Deficiency, Fractures)  2. Encounter for other general counseling or advice on contraception Probably entering menopause.  Declines contraception.  Condom usage recommended.  3. Secondary oligomenorrhea Will bring a withdrawal bleeding with Provera 5 mg 1 tablet per mouth for 7 days prior to follow-up with pelvic ultrasound.  Rule out menopause with [facility] today.  Patient had an ultrasound showing an endometrial focus measuring 1.5 cm last year.  She will follow-up for a pelvic ultrasound to reevaluate the endometrial line.  If confirmed menopause and  pelvic ultrasound is normal at follow-up, will consider hormone replacement therapy. - FSH - US Transvaginal Non-OB; Future  4. Screen for STD (sexually transmitted disease) Condom use recommended.   - HIV antibody (with reflex) - RPR - Hepatitis B Surface AntiGEN - Hepatitis C Antibody  5. Special screening examination for human papillomavirus (HPV)  - Pap IG, CT/NG NAA, and HPV (high risk)  6. Vulvar itching Mild vulvitis on left. Contact vulvitis?  Clobetasol, a thin layer daily on affected vulva for 2 weeks. Will reassess at f/u.  Other orders - medroxyPROGESTERone (PROVERA) 5 MG tablet; Take 1 tablet (5 mg total) by mouth daily for 7 days. Cyclic Provera daily x 7 days every 3 months when no period - clobetasol cream (TEMOVATE) 0.05 %; Apply 1 application topically daily for 14 days. Thin layer on vulva where itching  Counseling on above issues >50% x 15 minutes  Princess Bruins MD, 4:22 PM 09/06/2017

## 2017-09-07 ENCOUNTER — Encounter: Payer: Self-pay | Admitting: Obstetrics & Gynecology

## 2017-09-07 ENCOUNTER — Other Ambulatory Visit: Payer: 59

## 2017-09-07 DIAGNOSIS — Z01419 Encounter for gynecological examination (general) (routine) without abnormal findings: Secondary | ICD-10-CM | POA: Diagnosis not present

## 2017-09-07 DIAGNOSIS — N914 Secondary oligomenorrhea: Secondary | ICD-10-CM | POA: Diagnosis not present

## 2017-09-07 NOTE — Patient Instructions (Signed)
1. Encounter for routine gynecological examination with Papanicolaou smear of cervix Normal gynecologic exam except for mild left vulvitis.  Pap with high-risk HPV done today.  Breast exam normal.  Small area of increased pigmentation on the left breast corresponds to an area of healed inflammation.  Will repeat screening mammogram in April 2019.  Health labs here, will follow up to complete the fasting part.  Will do screening colonoscopy at age 52. - CBC - Comprehensive metabolic panel; Future - TSH - Lipid panel; Future - VITAMIN D 25 Hydroxy (Vit-D Deficiency, Fractures)  2. Encounter for other general counseling or advice on contraception Probably entering menopause.  Declines contraception.  Condom usage recommended.  3. Secondary oligomenorrhea Will bring a withdrawal bleeding with Provera 5 mg 1 tablet per mouth for 7 days prior to follow-up with pelvic ultrasound.  Rule out menopause with [facility] today.  Patient had an ultrasound showing an endometrial focus measuring 1.5 cm last year.  She will follow-up for a pelvic ultrasound to reevaluate the endometrial line.  If confirmed menopause and  pelvic ultrasound is normal at follow-up, will consider hormone replacement therapy. - FSH - US Transvaginal Non-OB; Future  4. Screen for STD (sexually transmitted disease) Condom use recommended.   - HIV antibody (with reflex) - RPR - Hepatitis B Surface AntiGEN - Hepatitis C Antibody  5. Special screening examination for human papillomavirus (HPV)  - Pap IG, CT/NG NAA, and HPV (high risk)  6. Vulvar itching Mild vulvitis on left. Contact vulvitis?  Clobetasol, a thin layer daily on affected vulva for 2 weeks. Will reassess at f/u.  Other orders - medroxyPROGESTERone (PROVERA) 5 MG tablet; Take 1 tablet (5 mg total) by mouth daily for 7 days. Cyclic Provera daily x 7 days every 3 months when no period - clobetasol cream (TEMOVATE) 0.05 %; Apply 1 application topically daily for 14 days.  Thin layer on vulva where itching  Khrystina, fue un placer conocerle hoy!  Voy a informarle de Financial trader.  [place] (Health Maintenance, Female) Un estilo de vida saludable y los cuidados preventivos pueden favorecer considerablemente a la salud y Musician. Pregunte a su mdico cul es el cronograma de exmenes peridicos apropiado para usted. Esta es una buena oportunidad para consultarlo sobre cmo prevenir enfermedades y [place] sano. Adems de los controles, hay muchas otras cosas que puede hacer usted mismo. Los expertos han realizado numerosas investigaciones [employer]itor cambios en el estilo de vida y las medidas de prevencin que, [place], lo ayudarn a mantenerse sano. Solicite a su mdico ms informacin. EL PESO Y LA DIETA Consuma una dieta saludable.  Asegrese de [employer] verduras, frutas, productos lcteos de bajo contenido de [place] y Advertising account planner.  No consuma muchos alimentos de alto contenido de grasas slidas, azcares agregados o sal.  Realice actividad fsica con regularidad. Esta es una de las prcticas ms importantes que puede hacer por su salud. ? La Delorise Shiner de los adultos deben hacer ejercicio durante al menos 140mnutos por semana. El ejercicio debe aumentar la frecuencia cardaca y pActorla transpiracin (ejercicio de iAmorita. ? La mayora de los adultos tambin deben hacer ejercicios de elongacin al mToysRusveces a la semana. Agregue esto al su plan de ejercicio de intensidad moderada. Mantenga un peso saludable.  El ndice de masa corporal ([facility] es una medida que puede utilizarse para identificar posibles problemas de pMascot Proporciona una estimacin de la grasa corporal basndose en el pLiberty Media  y la altura. Su mdico puede ayudarle a Radiation protection practitioner [place] y a Scientist, forensic o Theatre manager un peso saludable.  Para las mujeres de 20aos o ms: ? Un [facility] menor de 18,5 se considera bajo peso. ? Un [facility] entre  18,5 y 24,9 es normal. ? Un [facility] entre 25 y 29,9 se considera sobrepeso. ? Un IMC de 30 o ms se considera obesidad. Observe los niveles de colesterol y lpidos en la sangre.  Debe comenzar a English as a second language teacher de lpidos y Research officer, trade union en la sangre a los 20aos y luego repetirlos cada 32aos.  Es posible que Automotive engineer los niveles de colesterol con mayor frecuencia si: ? Sus niveles de lpidos y colesterol son altos. ? Es mayor de [id number]. ? Presenta un alto riesgo de padecer enfermedades cardacas. DETECCIN DE CNCER Cncer de pulmn  Se recomienda realizar exmenes de deteccin de cncer de pulmn a personas adultas entre 63 y 35 aos que estn en riesgo de Horticulturist, commercial de pulmn por sus antecedentes de consumo de tabaco.  Se recomienda una tomografa computarizada de baja dosis de los pulmones todos los aos a las personas que: ? Fuman actualmente. ? Hayan dejado el hbito en algn momento en los ltimos 15aos. ? Hayan fumado durante 30aos un paquete diario. Un paquete-ao equivale a fumar un promedio de un paquete de cigarrillos diario durante un ao.  Los exmenes de deteccin anuales deben continuar hasta que hayan pasado 15aos desde que dej de fumar.  Ya no debern realizarse si tiene un problema de salud que le impida recibir tratamiento para Science writer de pulmn. Cncer de mama  Practique la autoconciencia de la mama. Esto significa reconocer la apariencia normal de sus mamas y cmo las siente.  Tambin significa realizar autoexmenes regulares de [employer]. Informe a su mdico sobre cualquier cambio, sin importar cun pequeo sea.  Si tiene entre 20 y 47 aos, un mdico debe realizarle un examen clnico de las mamas como parte del examen regular de [place], cada 1 a 3aos.  Si tiene 40aos o ms, debe Information systems manager clnico de las Microsoft. Tambin considere realizarse una [place] todos los [place].  Si  tiene antecedentes familiares de cncer de mama, hable con su mdico para someterse a un estudio gentico.  Si tiene alto riesgo de Chief Financial Officer de mama, hable con su mdico para someterse a Public house manager y [employer].  La evaluacin del gen del cncer de mama (BRCA) se recomienda a mujeres que tengan familiares con cnceres relacionados con el BRCA. Los cnceres relacionados con el BRCA incluyen los siguientes: ? [place]. ? Ovario. ? Trompas. ? Cnceres de peritoneo.  Los resultados de la evaluacin determinarn la necesidad de asesoramiento gentico y de [place] de BRCA1 y BRCA2. Cncer de cuello del tero El mdico puede recomendarle que se haga pruebas peridicas de deteccin de cncer de los rganos de la pelvis (ovarios, tero y vagina). Estas pruebas incluyen un examen plvico, que abarca controlar si se produjeron cambios microscpicos en la superficie del cuello del tero (prueba de Papanicolaou). Pueden recomendarle que se haga estas pruebas cada 3aos, a partir de los 21aos.  A las mujeres que tienen entre 30 y 73aos, los mdicos pueden recomendarles que se sometan a exmenes plvicos y pruebas de Papanicolaou cada 47aos, o a la prueba de Papanicolaou y el examen plvico en combinacin con estudios de deteccin del virus del papiloma humano (VPH) cada 5aos. Algunos tipos  de VPH aumentan el riesgo de Chief Financial Officer de cuello del tero. La prueba para la deteccin del VPH tambin puede realizarse a mujeres de cualquier edad cuyos resultados de la prueba de Papanicolaou no sean claros.  Es posible que otros mdicos no recomienden exmenes de deteccin a mujeres no embarazadas que se consideran sujetos de bajo riesgo de Chief Financial Officer de pelvis y que no tienen sntomas. Pregntele al mdico si un examen plvico de deteccin es adecuado para usted.  Si ha recibido un tratamiento para Science writer cervical o una enfermedad que podra causar cncer, necesitar  realizarse una prueba de Papanicolaou y controles durante al menos 18 aos de concluido el [place]. Si no se ha hecho el Papanicolaou con regularidad, debern volver a evaluarse los factores de riesgo (como tener un nuevo compaero sexual), para Teacher, adult education si debe realizarse los estudios nuevamente. Algunas mujeres sufren problemas mdicos que aumentan la probabilidad de Museum/gallery curator cncer de cuello del tero. En estos casos, el mdico podr [employer] se realicen controles y pruebas de Papanicolaou con ms frecuencia. Cncer colorrectal  Este tipo de cncer puede detectarse y a menudo prevenirse.  Por lo general, los estudios de rutina se deben Medical laboratory scientific officer a Field seismologist a Proofreader de los 36 aos y [place] 61 aos.  Sin embargo, el mdico podr aconsejarle que lo haga antes, si tiene factores de riesgo para el cncer de colon.  Tambin puede recomendarle que use un kit de prueba para Hydrologist en la materia fecal.  Es posible que se use una pequea cmara en el extremo de un tubo para examinar directamente el colon (sigmoidoscopia o colonoscopia) a fin de Hydrographic surveyor formas tempranas de cncer colorrectal.  Los exmenes de rutina generalmente comienzan a los 58aos.  El examen directo del colon se debe repetir cada 5 a 10aos hasta los 75aos. Sin embargo, es posible que se realicen exmenes con mayor frecuencia, si se detectan formas tempranas de plipos precancerosos o pequeos bultos. Cncer de piel  Revise la piel de la cabeza a los pies con regularidad.  Informe a su mdico si aparecen nuevos lunares o los que tiene se modifican, especialmente en su forma y color.  Tambin notifique al mdico si tiene un lunar que es ms grande que el tamao de una goma de lpiz.  Siempre use pantalla solar. Aplique pantalla solar de Kerry Dory y repetida a lo largo del Training and development officer.  Protjase usando mangas y [employer], un sombrero de ala ancha y gafas para el sol, siempre que se encuentre en el  exterior. ENFERMEDADES CARDACAS, DIABETES E HIPERTENSIN ARTERIAL  La hipertensin arterial causa enfermedades cardacas y [place] el riesgo de ictus. La hipertensin arterial es ms probable en los siguientes casos: ? Las personas que tienen la presin arterial en el extremo del rango normal (100-139/85-89 mm Hg). ? [employer] con sobrepeso u obesidad. ? Scientist, water quality.  Si usted tiene entre 18 y 39 aos, debe medirse la presin arterial cada 3 a 5 aos. Si usted tiene 40 aos o ms, debe medirse la presin arterial [employer]. Debe medirse la presin arterial dos veces: una vez cuando est en un hospital o una clnica y la otra vez cuando est en otro sitio. Registre el promedio de [employer]. Para controlar su presin arterial cuando no est en un hospital o [place], puede usar lo siguiente: ? Jorje Guild automtica para medir la presin arterial en una farmacia. ? Un monitor para medir la presin arterial  en el hogar.  Si tiene entre 65 y 74 aos, consulte a su mdico si debe tomar aspirina para prevenir el ictus.  Realcese exmenes de deteccin de la diabetes con regularidad. Esto incluye la toma de [place] de sangre para controlar el nivel de azcar en la sangre durante el [place]. ? Si tiene un peso normal y un bajo riesgo de padecer diabetes, realcese este anlisis cada tres aos despus de los 45aos. ? Si tiene sobrepeso y un alto riesgo de padecer diabetes, considere someterse a este anlisis antes o con mayor frecuencia. PREVENCIN DE INFECCIONES HepatitisB  Si tiene un riesgo ms alto de Museum/gallery curator hepatitis B, debe someterse a un examen de deteccin de este virus. Se considera que tiene un alto riesgo de contraer hepatitis B si: ? Naci en un pas donde la hepatitis B es frecuente. Pregntele a su mdico qu pases son considerados de Public affairs consultant. ? Sus padres nacieron en un pas de alto riesgo y usted no recibi una vacuna que lo proteja contra  la hepatitis B (vacuna contra la hepatitis B). ? [place]. ? [place] agujas para inyectarse drogas. ? Vive con alguien que tiene hepatitis B. ? Ha tenido sexo con alguien que tiene hepatitis B. ? Recibe tratamiento de hemodilisis. ? Toma ciertos medicamentos para el cncer, trasplante de rganos y afecciones autoinmunitarias. Hepatitis C  Se recomienda un anlisis de [place] para: ? [employer] 1945 y 1965. ? Todas las personas que tengan un riesgo de haber contrado hepatitis C. Enfermedades de transmisin sexual (ETS).  Debe realizarse pruebas de deteccin de enfermedades de transmisin sexual (ETS), incluidas gonorrea y clamidia si: ? Es sexualmente activo y es menor de 10XNA. ? Es mayor de 24aos, y Investment banker, operational informa que corre riesgo de tener este tipo de infecciones. ? La actividad sexual ha cambiado desde que le hicieron la ltima prueba de deteccin y tiene un riesgo mayor de Best boy clamidia o Radio broadcast assistant. Pregntele al mdico si usted tiene riesgo.  Si no tiene el VIH, pero corre riesgo de infectarse por el virus, se recomienda tomar diariamente un medicamento recetado para evitar la infeccin. Esto se conoce como profilaxis previa a la exposicin. Se considera que est en riesgo si: ? Es [place] sexualmente y no [place] preservativos habitualmente o no conoce el estado del VIH de sus Advertising copywriter. ? Se inyecta drogas. ? Es [place] sexualmente con Ardelia Mems pareja que tiene VIH. Consulte a su mdico para saber si tiene un alto riesgo de infectarse por el VIH. Si opta por comenzar la profilaxis previa a la exposicin, primero debe realizarse anlisis de deteccin del VIH. Luego, le harn anlisis cada 61mses mientras est tomando los medicamentos para la profilaxis previa a la exposicin. [facility] Si es premenopusica y puede quedar [place] solicite a su mdico asesoramiento previo a la concepcin.  Si puede quedar embarazada, tome 400 a 8062mrogramos (mcg)  de cido flAnheuser-Busch Si desea evitar el embarazo, hable con su mdico sobre el control de la natalidad (anticoncepcin). OSTEOPOROSIS Y MENOPAUSIA  La osteoporosis es una enfermedad en la que los huesos pierden los minerales y la fuerza por el avance de la edad. El resultado pueden ser fracturas graves en los huMead ValleyEl riesgo de osteoporosis puede identificarse con unArdelia Memsrueba de densidad sea.  Si tiene 65aos o ms, o si est en riesgo de sufrir osteoporosis y fracturas, pregunte a su mdico si debe someterse a exmenes.  Consulte  a su mdico si debe tomar un suplemento de calcio o de vitamina D para reducir el riesgo de osteoporosis.  La menopausia puede presentar ciertos sntomas fsicos y [place].  La terapia de reemplazo hormonal puede reducir algunos de estos sntomas y [place]. Consulte a su mdico para saber si la terapia de reemplazo hormonal es conveniente para usted. INSTRUCCIONES PARA EL CUIDADO EN EL HOGAR  Realcese los estudios de rutina de la salud, dentales y de Public librarian.  [place].  No consuma ningn producto que contenga tabaco, lo que incluye cigarrillos, tabaco de Higher education careers adviser o Psychologist, sport and exercise.  Si est embarazada, no beba alcohol.  Si est amamantando, reduzca el consumo de alcohol y la frecuencia con la que consume.  Si es mujer y no est embarazada limite el consumo de alcohol a no ms de 1 medida por da. Una medida equivale a 12onzas de cerveza, 5onzas de vino o 1onzas de bebidas alcohlicas de alta graduacin.  No consuma drogas.  No comparta agujas.  Solicite ayuda a su mdico si necesita apoyo o informacin para abandonar las drogas.  Informe a su mdico si a menudo se siente deprimido.  Notifique a su mdico si alguna vez ha sido vctima de abuso o si no se siente seguro en su hogar. Esta informacin no tiene Marine scientist el consejo del mdico. Asegrese de hacerle al mdico cualquier pregunta  que tenga. Document Released: 06/04/2011 Document Revised: 07/06/2014 Document Reviewed: 03/19/2015 Elsevier Interactive Patient Education  [employer].

## 2017-09-08 ENCOUNTER — Institutional Professional Consult (permissible substitution): Payer: 59 | Admitting: Internal Medicine

## 2017-09-08 LAB — COMPREHENSIVE METABOLIC PANEL
AG RATIO: 1.5 (calc) (ref 1.0–2.5)
ALKALINE PHOSPHATASE (APISO): 66 U/L (ref 33–115)
ALT: 16 U/L (ref 6–29)
AST: 14 U/L (ref 10–35)
Albumin: 3.9 g/dL (ref 3.6–5.1)
BILIRUBIN TOTAL: 0.4 mg/dL (ref 0.2–1.2)
BUN: 19 mg/dL (ref 7–25)
CALCIUM: 9.3 mg/dL (ref 8.6–10.2)
CO2: 29 mmol/L (ref 20–32)
Chloride: 103 mmol/L (ref 98–110)
Creat: 0.69 mg/dL (ref 0.50–1.10)
Globulin: 2.6 g/dL (calc) (ref 1.9–3.7)
Glucose, Bld: 86 mg/dL (ref 65–99)
Potassium: 4 mmol/L (ref 3.5–5.3)
Sodium: 139 mmol/L (ref 135–146)
Total Protein: 6.5 g/dL (ref 6.1–8.1)

## 2017-09-08 LAB — VITAMIN D 25 HYDROXY (VIT D DEFICIENCY, FRACTURES): VIT D 25 HYDROXY: 23 ng/mL — AB (ref 30–100)

## 2017-09-08 LAB — CBC
HEMATOCRIT: 37.7 % (ref 35.0–45.0)
HEMOGLOBIN: 12.9 g/dL (ref 11.7–15.5)
MCH: 28 pg (ref 27.0–33.0)
MCHC: 34.2 g/dL (ref 32.0–36.0)
MCV: 81.8 fL (ref 80.0–100.0)
MPV: 10.8 fL (ref 7.5–12.5)
Platelets: 295 10*3/uL (ref 140–400)
RBC: 4.61 10*6/uL (ref 3.80–5.10)
RDW: 13 % (ref 11.0–15.0)
WBC: 7.8 10*3/uL (ref 3.8–10.8)

## 2017-09-08 LAB — HEPATITIS C ANTIBODY
Hepatitis C Ab: NONREACTIVE
SIGNAL TO CUT-OFF: 0.04 (ref <1.00)

## 2017-09-08 LAB — LIPID PANEL
CHOL/HDL RATIO: 2.5 (calc) (ref <5.0)
CHOLESTEROL: 199 mg/dL (ref <200)
HDL: 79 mg/dL (ref >50)
LDL Cholesterol (Calc): 96 mg/dL (calc)
Non-HDL Cholesterol (Calc): 120 mg/dL (calc) (ref <130)
TRIGLYCERIDES: 142 mg/dL (ref <150)

## 2017-09-08 LAB — FOLLICLE STIMULATING HORMONE: FSH: 92.2 m[IU]/mL

## 2017-09-08 LAB — RPR: RPR Ser Ql: NONREACTIVE

## 2017-09-08 LAB — TSH: TSH: 1.12 m[IU]/L

## 2017-09-08 LAB — HEPATITIS B SURFACE ANTIGEN: HEP B S AG: NONREACTIVE

## 2017-09-08 LAB — HIV ANTIBODY (ROUTINE TESTING W REFLEX): HIV: NONREACTIVE

## 2017-09-09 ENCOUNTER — Other Ambulatory Visit: Payer: Self-pay | Admitting: Obstetrics & Gynecology

## 2017-09-09 DIAGNOSIS — Z1231 Encounter for screening mammogram for malignant neoplasm of breast: Secondary | ICD-10-CM

## 2017-09-10 LAB — PAP IG, CT-NG NAA, HPV HIGH-RISK
C. trachomatis RNA, TMA: NOT DETECTED
HPV DNA High Risk: NOT DETECTED
N. GONORRHOEAE RNA, TMA: NOT DETECTED

## 2017-10-04 ENCOUNTER — Encounter: Payer: Self-pay | Admitting: Adult Health

## 2017-10-04 ENCOUNTER — Ambulatory Visit: Payer: 59 | Admitting: Adult Health

## 2017-10-04 DIAGNOSIS — J4520 Mild intermittent asthma, uncomplicated: Secondary | ICD-10-CM | POA: Diagnosis not present

## 2017-10-04 NOTE — Patient Instructions (Signed)
May remain off Symbicort , if symptoms restart call our office so we can restart.  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  Follow up with Dr. Elsworth Soho  In 6 months and As needed

## 2017-10-04 NOTE — Assessment & Plan Note (Signed)
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  Patient symptoms resolved once she was away from this trigger.  She does not appear to need Symbicort at this time.  Have advised if symptoms return to call our office so we can restart this and consider spirometry. Work note to avoid heavy dust exposure and to avoid sandblasting   Plan  Patient Instructions  May remain off Symbicort , if symptoms restart call our office so we can restart.  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  Follow up with Dr. Elsworth Soho  In 6 months and As needed

## 2017-10-04 NOTE — Progress Notes (Signed)
@Patient  ID: Chelsea Barrett, female    DOB: 1965/09/01, 52 y.o.   MRN: [id number]  Chief Complaint  Patient presents with  . 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  Patient was seen last visit for a pulmonary consult for recurrent cough and wheezing.  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  She was recommended to avoid this exposure.  She was started on Symbicort.  Patient says she is much improved.  Cough and symptoms have totally resolved.  She has stopped her Symbicort couple weeks ago.  She says she has had no further symptoms since being away from the sandblasting exposure. She denies any chest pain, shortness of breath, or wheezing.Marland Kitchen     No Known Allergies   There is no immunization history on file for this patient.  Past Medical History:  Diagnosis Date  . Chalazion of both eyes   . Endometriosis   . Lumbar herniated disc    L5  . Vitamin D deficiency     Tobacco History: Social History   Tobacco Use  Smoking Status Never Smoker  Smokeless Tobacco Never Used   Counseling given: Not Answered   Outpatient Encounter Medications as of 10/04/2017  Medication Sig  . albuterol (PROVENTIL HFA;VENTOLIN HFA) 108 (90 Base) MCG/ACT inhaler Inhale 1-2 puffs into the lungs every 6 (six) hours as needed for wheezing or shortness of breath.  Marland Kitchen albuterol (PROVENTIL) (2.5 MG/3ML) 0.083% nebulizer solution Take 3 mLs (2.5 mg total) by nebulization every 6 (six) hours as needed for wheezing or shortness of breath.  . Ascorbic Acid (VITAMIN C) 100 MG tablet Take 100 mg by mouth daily.  . fluticasone (FLONASE) 50 MCG/ACT nasal spray Place 2 sprays  into both nostrils daily.  . medroxyPROGESTERone (PROVERA) 5 MG tablet Take 1 tablet (5 mg total) by mouth daily for 7 days. Cyclic Provera daily x 7 days every 3 months when no period  . Multiple Vitamin (MULTIVITAMIN) tablet Take 1 tablet by mouth daily.  . budesonide-formoterol (SYMBICORT) 160-4.5 MCG/ACT inhaler Inhale 2 puffs into the lungs 2 (two) times daily. (Patient not taking: Reported on 10/04/2017)  . [DISCONTINUED] HYDROcodone-homatropine (HYCODAN) 5-1.5 MG/5ML syrup Take 5 mLs by mouth every 8 (eight) hours as needed. (Patient not taking: Reported on 10/04/2017)  . [DISCONTINUED] HYDROcodone-homatropine (HYCODAN) 5-1.5 MG/5ML syrup Take 5 mLs by mouth every 8 (eight) hours as needed for cough. (Patient not taking: Reported on 10/04/2017)  . [DISCONTINUED] Norethindrone Acetate-Ethinyl Estradiol (JUNEL,LOESTRIN,MICROGESTIN) 1.5-30 MG-MCG tablet Take 1 tablet by mouth daily.  . [DISCONTINUED] predniSONE (DELTASONE) 10 MG tablet 6 TAB PO DAY 1 5 TAB PO DAY 2 4 TAB PO DAY 3 3 TAB PO DAY 4 2 TAB PO DAY 5 1 TAB PO DAY 6 (Patient not taking: Reported on 10/04/2017)   No facility-administered encounter medications on file as of 10/04/2017.      Review of Systems  Constitutional:   No  weight loss, night sweats,  Fevers, chills, fatigue, or  lassitude.  HEENT:   No headaches,  Difficulty swallowing,  Tooth/dental problems, or  Sore throat,  No sneezing, itching, ear ache, nasal congestion, post nasal drip,   CV:  No chest pain,  Orthopnea, PND, swelling in lower extremities, anasarca, dizziness, palpitations, syncope.   GI  No heartburn, indigestion, abdominal pain, nausea, vomiting, diarrhea, change in bowel habits, loss of appetite, bloody stools.   Resp: No shortness of breath with exertion or at rest.  No excess mucus, no productive cough,  No non-productive cough,  No coughing up of blood.  No change in color of mucus.  No wheezing.  No chest wall deformity  Skin: no rash  or lesions.  GU: no dysuria, change in color of urine, no urgency or frequency.  No flank pain, no hematuria   MS:  No joint pain or swelling.  No decreased range of motion.  No back pain.    Physical Exam  BP 100/62 (BP Location: Left Arm, Cuff Size: Normal)   Pulse 72   Ht 5\' 2"  (1.575 m)   Wt 126 lb 9.6 oz (57.4 kg)   SpO2 98%   BMI 23.16 kg/m   GEN: A/Ox3; pleasant , NAD, well nourished    HEENT:  [place]/AT,  EACs-clear, TMs-wnl, NOSE-clear, THROAT-clear, no lesions, no postnasal drip or exudate noted.   NECK:  Supple w/ fair ROM; no JVD; normal carotid impulses w/o bruits; no thyromegaly or nodules palpated; no lymphadenopathy.    RESP  Clear  P & A; w/o, wheezes/ rales/ or rhonchi. no accessory muscle use, no dullness to percussion  CARD:  RRR, no m/r/g, no peripheral edema, pulses intact, no cyanosis or clubbing.  GI:   Soft & nt; nml bowel sounds; no organomegaly or masses detected.   Musco: Warm bil, no deformities or joint swelling noted.   Neuro: alert, no focal deficits noted.    Skin: Warm, no lesions or rashes    Lab Results:  CBC    Component Value Date/Time   WBC 7.8 09/07/2017 0822   RBC 4.61 09/07/2017 0822   HGB 12.9 09/07/2017 0822   HCT 37.7 09/07/2017 0822   PLT 295 09/07/2017 0822   MCV 81.8 09/07/2017 0822   MCH 28.0 09/07/2017 0822   MCHC 34.2 09/07/2017 0822   RDW 13.0 09/07/2017 0822   LYMPHSABS 2,006 08/12/2016 0936   MONOABS 295 08/12/2016 0936   EOSABS 118 08/12/2016 0936   BASOSABS 0 08/12/2016 0936    BMET    Component Value Date/Time   NA 139 09/07/2017 0822   K 4.0 09/07/2017 0822   CL 103 09/07/2017 0822   CO2 29 09/07/2017 0822   GLUCOSE 86 09/07/2017 0822   BUN 19 09/07/2017 0822   CREATININE 0.69 09/07/2017 0822   CALCIUM 9.3 09/07/2017 0822    BNP No results found for: BNP  ProBNP No results found for: PROBNP  Imaging: No results found.   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  Patient symptoms resolved once she was away from this trigger.  She does not appear to need Symbicort at this time.  Have advised if symptoms return to call our office so we can restart this and consider spirometry. Work note to avoid heavy dust exposure and to avoid sandblasting   Plan  Patient Instructions  May remain off Symbicort , if symptoms restart call our office so we can restart.  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  Follow up with Dr. Elsworth Soho  In 6 months and As needed  Rexene Edison, NP 10/04/2017

## 2017-10-06 ENCOUNTER — Encounter: Payer: Self-pay | Admitting: Obstetrics & Gynecology

## 2017-10-06 ENCOUNTER — Ambulatory Visit (INDEPENDENT_AMBULATORY_CARE_PROVIDER_SITE_OTHER): Payer: 59 | Admitting: Obstetrics & Gynecology

## 2017-10-06 ENCOUNTER — Ambulatory Visit (INDEPENDENT_AMBULATORY_CARE_PROVIDER_SITE_OTHER): Payer: 59

## 2017-10-06 ENCOUNTER — Other Ambulatory Visit: Payer: Self-pay | Admitting: Obstetrics & Gynecology

## 2017-10-06 VITALS — BP 124/80

## 2017-10-06 DIAGNOSIS — N9489 Other specified conditions associated with female genital organs and menstrual cycle: Secondary | ICD-10-CM

## 2017-10-06 DIAGNOSIS — Z780 Asymptomatic menopausal state: Secondary | ICD-10-CM

## 2017-10-06 DIAGNOSIS — R9389 Abnormal findings on diagnostic imaging of other specified body structures: Secondary | ICD-10-CM

## 2017-10-06 DIAGNOSIS — N914 Secondary oligomenorrhea: Secondary | ICD-10-CM

## 2017-10-06 NOTE — Progress Notes (Addendum)
    Chelsea Barrett [birth year] [id number]        52 y.o.  G0P0000   RP: Oligomenorrhea with IU focus for Pelvic US  HPI: LMP/Vaginal bleeding 05/09/2017.  Pelvic US 07/2016:  IU focus 1.5 x 1.2 cm.  Did not f/u for [facility].  Symptomatic menopause with [place] 92.2 on 09/07/2017.  No HRT.  No pelvic pain.  C/O Rt outer lower Breast pain x 1 week.  Due for Mammo.     OB History  Gravida Para Term Preterm AB Living  0 0 0 0 0 0  SAB TAB Ectopic Multiple Live Births  0 0 0 0      Past medical history,surgical history, problem list, medications, allergies, family history and social history were all reviewed and documented in the EPIC chart.   Directed ROS with pertinent positives and negatives documented in the history of present illness/assessment and plan.  Exam:  Vitals:   10/06/17 1622  BP: 124/80   General appearance:  Normal  Pelvic US today: T/V images.  Anteverted uterus measuring 9.47 x 5.56 x 4.4 cm.  Subserosal fibroids measuring 1.5 x 1.1 cm.  Prominent endometrial lining measuring 18.3 mm with an echogenic focus measuring 2.0 x 1.6 cm.  Vascular flow present to the endometrial mass.  Right and left ovaries normal.  No free fluid in the posterior cul-de-sac.   Assessment/Plan:  52 y.o. G0P0000   1. Endometrial thickening on ultrasound Thickened endometrial lining at 18.3 mm with an echogenic focus measuring 2.0 x 1.6 cm.  Vascular flow present to this lesion.  Patient will follow up for a sonohysterogram.  Probably an endometrial polyp or submucosal fibroid, precancer and cancer not likely, but to rule out.  Will most likely need a hysteroscopy with excision and D&C. - [place] Sonohysterogram; Future  2. Menopause present Vasomotor symptoms present.  No HRT.  Will consider hormone replacement therapy after completing the investigation and probable surgery for the intrauterine lesion.  Counseling on above issues and coordination of care more than 50% for 15  minutes.  Princess Bruins MD, 4:35 PM 10/06/2017

## 2017-10-06 NOTE — Patient Instructions (Signed)
1. Endometrial thickening on ultrasound Thickened endometrial lining at 18.3 mm with an echogenic focus measuring 2.0 x 1.6 cm.  Vascular flow present to this lesion.  Patient will follow up for a sonohysterogram.  Probably an endometrial polyp or submucosal fibroid, precancer and cancer not likely, but to rule out.  Will most likely I am working on it I told her that I am working on it but it is coming up I met the last 2 minutes need hysteroscopy with excision and D&C. - [place] Sonohysterogram; Future  2. Menopause present Vasomotor symptoms present.  No HRT.  Will consider hormone replacement therapy after completing the investigation and probable surgery for the intrauterine lesion.  Chelsea Barrett, good seeing you today!   Sonohysterogram A sonohysterogram is a procedure to examine the inside of the uterus. This exam uses sound waves that are sent to a computer to make images of the lining of the uterus (endometrium). To get the best images, a germ-free, salt-water solution (sterile saline) is put into the uterus through the vagina. You may have this procedure if you have certain reproductive problems, such as abnormal bleeding, infertility, or miscarriage. This procedure can show what may be causing these problems. Possible causes include scarring or abnormal growths such as fibroids inside your uterus. It can also show if your uterus is an abnormal shape or if the lining of the uterus is too thin. Tell a health care provider about:  All medicines you are taking, including vitamins, herbs, eye drops, creams, and over-the-counter medicines.  Any allergies you have.  Any blood disorders you have.  Any surgeries you have had.  Any medical conditions you have.  Whether you are pregnant or may be pregnant.  The date of the first day of your last period.  Any signs of infection, such as fever, pain in your lower abdomen, or abnormal discharge from your vagina. What are the risks? Generally, this is  a safe procedure. However, problems may occur, including:  Abdominal pain or cramping.  Light bleeding (spotting).  Increased vaginal discharge.  Infection.  What happens before the procedure?  Your health care provider may have you take an over-the-counter pain medicine.  You may be given medicine to stop any abnormal bleeding.  You may be given antibiotic medicine to help prevent infection.  You may be asked to take a pregnancy test. This is usually in the form of a urine test.  You may have a pelvic exam.  You will be asked to empty your bladder. What happens during the procedure?  You will lie down on the exam table with your feet in stirrups or with your knees bent and your feet flat on the table.  A slender, handheld device (transducer) will be lubricated and placed into your vagina.  The transducer will be positioned to send sound waves to your uterus. The sound waves are sent to a computer and are turned into images, which your health care provider sees during the procedure.  The transducer will be removed from your vagina.  An instrument will be inserted to widen the opening of your vagina (speculum).  A swab with germ-killing solution (antiseptic) will be used to clean the opening to your uterus (cervix).  A long, thin tube (catheter) will be placed through your cervix into your uterus.  The speculum will be removed.  The transducer will be placed back into your vagina to take more images.  Your uterus will be filled with a germ-free, salt-water solution (sterile  saline) through the catheter. You may feel some cramping.  A fluid that contains air bubbles may be sent through the catheter to make it easier to see the fallopian tubes.  The transducer and catheter will be removed. The procedure may vary among health care providers and hospitals. What happens after the procedure?  It is up to you to get the results of your procedure. Ask your health care  provider, or the department that is doing the procedure, when your results will be ready. Summary  A sonohysterogram is a procedure that creates images of the inside of the uterus.  The risks of this procedure are very low. Most women experience cramping and spotting after the procedure.  You may need to have a pelvic exam and take a pregnancy test before this procedure. This procedure will not be done if you are pregnant or have an infection. This information is not intended to replace advice given to you by your health care provider. Make sure you discuss any questions you have with your health care provider. Document Released: 10/30/2013 Document Revised: 05/11/2016 Document Reviewed: 05/11/2016 Elsevier Interactive Patient Education  2017 [employer].

## 2017-10-07 ENCOUNTER — Telehealth: Payer: Self-pay | Admitting: *Deleted

## 2017-10-07 DIAGNOSIS — N644 Mastodynia: Secondary | ICD-10-CM

## 2017-10-07 NOTE — Telephone Encounter (Signed)
-----   Message from Princess Bruins, MD sent at 10/06/2017  4:50 PM EDT ----- Regarding: Rt breast pain outer lower quadrant Rt breast pain outer lower quadrant.  Schedule Rt Dx Mammo/US and Lt screening Mammo.

## 2017-10-07 NOTE — Telephone Encounter (Signed)
Patient informed appointment April 15. Patient states date does not work for her so I provided phone number to [facility] for patient to call and reschedule.

## 2017-10-07 NOTE — Telephone Encounter (Signed)
Pt scheduled on 05/13/18 @ 11:20am pt is spanish speaking will route to [place] to relay

## 2017-10-11 ENCOUNTER — Other Ambulatory Visit: Payer: 59

## 2017-10-12 ENCOUNTER — Ambulatory Visit
Admission: RE | Admit: 2017-10-12 | Discharge: 2017-10-12 | Disposition: A | Discharge status: Home / Self Care | Payer: 59 | Source: Ambulatory Visit | Attending: Obstetrics & Gynecology | Admitting: Obstetrics & Gynecology

## 2017-10-12 ENCOUNTER — Ambulatory Visit: Payer: 59

## 2017-10-12 DIAGNOSIS — N644 Mastodynia: Secondary | ICD-10-CM

## 2017-10-12 DIAGNOSIS — R922 Inconclusive mammogram: Secondary | ICD-10-CM | POA: Diagnosis not present

## 2017-10-13 ENCOUNTER — Other Ambulatory Visit: Payer: Self-pay | Admitting: Obstetrics & Gynecology

## 2017-10-13 ENCOUNTER — Ambulatory Visit (INDEPENDENT_AMBULATORY_CARE_PROVIDER_SITE_OTHER): Payer: 59

## 2017-10-13 ENCOUNTER — Ambulatory Visit (INDEPENDENT_AMBULATORY_CARE_PROVIDER_SITE_OTHER): Payer: 59 | Admitting: Obstetrics & Gynecology

## 2017-10-13 DIAGNOSIS — N840 Polyp of corpus uteri: Secondary | ICD-10-CM | POA: Diagnosis not present

## 2017-10-13 DIAGNOSIS — R350 Frequency of micturition: Secondary | ICD-10-CM

## 2017-10-13 DIAGNOSIS — N959 Unspecified menopausal and perimenopausal disorder: Secondary | ICD-10-CM

## 2017-10-13 DIAGNOSIS — R9389 Abnormal findings on diagnostic imaging of other specified body structures: Secondary | ICD-10-CM

## 2017-10-13 DIAGNOSIS — N950 Postmenopausal bleeding: Secondary | ICD-10-CM

## 2017-10-13 NOTE — Progress Notes (Signed)
    Chelsea Barrett 30-May-1966 [id number]        52 y.o.  G0P0000   RP: Thickened endometrium, probable intrauterine lesion for sonohysterogram  HPI: Menopausal, on no hormone replacement therapy.  No vaginal bleeding since November 2018.  Intra-uterine focus measuring 2 cm on last pelvic ultrasound.   OB History  Gravida Para Term Preterm AB Living  0 0 0 0 0 0  SAB TAB Ectopic Multiple Live Births  0 0 0 0      Past medical history,surgical history, problem list, medications, allergies, family history and social history were all reviewed and documented in the EPIC chart.   Directed ROS with pertinent positives and negatives documented in the history of present illness/assessment and plan.  Exam:  There were no vitals filed for this visit. General appearance:  Normal                                                                    Sono Infusion Hysterogram ( procedure note)   The initial transvaginal ultrasound demonstrated the following: See previous ultrasound findings on October 06, 2017.  The speculum  was inserted and the cervix cleansed with Betadine solution after confirming that patient has no allergies.A small sonohysterography catheterwas utilized.  Insertion was facilitated with ring forceps, using a spear-like motion the catheter was inserted to the fundus of the uterus. The speculum is then removed carefully to avoid dislodging the catheter. The catheter was flushed with sterile saline delete prior to insertion to rid it of small amounts of air.the sterile saline solution was infused into the uterine cavity as a vaginal ultrasound probe was then placed in the vagina for full visualization of the uterine cavity from a transvaginal approach. The following was noted: Following injection of saline the endometrium is filled, showing a defect at the posterior fundus measuring 2.1 x 1.5 x 1.6 cm.  The catheter was then removed after retrieving some of the saline from the  intrauterine cavity. An endometrial biopsy was not done. Patient tolerated procedure well. She had received a tablet of Aleve for discomfort.    U/A: Dark yellow cloudy, nitrites negative, white blood cells negative, red blood cells more than 60, bacteria few.  Urine culture pending.   Assessment/Plan:  52 y.o. G0P0000   1. 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  We will proceed with hysteroscopy, MyoSure resection and D&C.  Surgery and risks reviewed with patient.  Pamphlet given.  Will follow-up for preop visit.  2. Urinary frequency Mildly perturbed urine analysis, the large amount of red blood cells is probably due to the vaginal bleeding post sonohysterogram.  Will wait on the urine culture to decide on treatment.  Counseling on above issues and coordination of care more than 50% of 15 minutes.  Princess Bruins MD, 4:36 PM 10/13/2017

## 2017-10-14 ENCOUNTER — Telehealth: Payer: Self-pay

## 2017-10-14 ENCOUNTER — Encounter: Payer: Self-pay | Admitting: Obstetrics & Gynecology

## 2017-10-14 DIAGNOSIS — R350 Frequency of micturition: Secondary | ICD-10-CM | POA: Diagnosis not present

## 2017-10-14 NOTE — Patient Instructions (Signed)
1. 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  We will proceed with hysteroscopy, MyoSure resection and D&C.  Surgery and risks reviewed with patient.  Pamphlet given.  Will follow-up for preop visit.  2. Urinary frequency Mildly perturbed urine analysis, the large amount of red blood cells is probably due to the vaginal bleeding post sonohysterogram.  Will wait on the urine culture to decide on treatment.  Adelina, good seeing you today!  Histeroscopa (Hysteroscopy) La histeroscopa es un procedimiento que se [place] para observar el interior de la matriz (tero) Puede indicarse por diferentes motivos, entre ellos:  Para evaluar una hemorragia anormal, un fibroma (tumor benigno, no canceroso), tumores, plipos o tejido cicatrizal Sport and exercise psychologist) y la posibilidad de cncer de tero.  Para detectar bultos (tumores) y otros crecimientos anormales uterinos.  Para buscar las causas por las que una mujer no queda embarazada (infertilidad) causas recurrentes de prdida de embarazo (abortos espontneos) o por la prdida de un dispositivo intrauterino (DIU).  Para realizar un procedimiento de esterilizacin cerrando las trompas de [place] desde adentro del tero. En este procedimiento, se coloca un tubo delgado y luminoso, con una cmara en el extremo (histeroscopio)para observar el interior del tero. La histeroscopa se realiza inmediatamente despus del perodo menstrual para asegurarse de que no existe embarazo. INFORME A SU MDICO:  Cualquier alergia que tenga.  Todos los [employer] [place], incluyendo vitaminas, hierbas, gotas oftlmicas, cremas y medicamentos de venta libre.  Problemas previos que usted o los [employer] de su familia hayan tenido con el uso de anestsicos.  Enfermedades de [employer].  Cirugas previas.  Padecimientos mdicos.  RIESGOS Y COMPLICACIONES Generalmente es un procedimiento seguro. Sin embargo,  Games developer procedimiento, pueden surgir complicaciones. Las complicaciones posibles son:  Perforacin del tero.  Sangrado excesivo.  Infeccin.  Lesin en el cuello del tero.  Lesiones en otros rganos.  Reaccin alrgica a un medicamento.  Inoculacin de lquido en exceso dentro del tero. ANTES DEL PROCEDIMIENTO  Consulte a su mdico si debe cambiar o suspender los medicamentos que toma habitualmente.  No tome aspirina ni anticoagulantes durante la semana previa al procedimiento, o segn le hayan indicado. Pueden ocasionar hemorragias.  Si fuma, no lo haga durante las [employer] al procedimiento.  En algunos casos, el da anterior al procedimiento se coloca un medicamento en el cuello del tero. Este medicamento dilata el cuello y [place] la abertura. Esto facilita la insercin del instrumento en el tero durante el procedimiento.  No debe comer ni beber nada durante al menos 8 horas antes de la [place].  Pdale a alguna persona que la lleve a su casa luego del procedimiento.  PROCEDIMIENTO  Le administrarn un medicamento para relajarse (sedante). Tambin podrn administrarle uno de los siguientes medicamentos: ? Medicamentos que adormecen el rea del cuello uterino (anestesia local). ? Un medicamento para que duerma durante el procedimiento (anestesia genera).  El histeroscopio se inserta a travs de la vagina, dentro del tero. La cmara del laparoscopio enva una imagen a una pantalla de televisin que se encuentra en el quirfano. [employer] modo, el mdico tendr Ardelia Mems buena visin del interior del tero.  Durante el Wellsite geologist un lquido o aire en el interior de [place], lo que le permitir al cirujano observar mejor.  En algunas ocasiones, el tejido del interior del tero se legra suavemente. Esas muestras de tejido se envan al laboratorio para ser examinadas.  DESPUS DEL PROCEDIMIENTO  Si  le han administrado anestesia general podr  sentirse atontada durante algunas horas despus del procedimiento.  Si se utiliza Tour manager, podr volver a su casa tan pronto como est estable y se sienta en condiciones.  Podr sentir clicos leves. Es normal que esto dure un par [employer].  Podr tener una hemorragia, la que puede variar entre una pequea mancha durante algunos das hasta una hemorragia similar a Mining engineer durante 3-7 das. Esto es normal.  Si el resultado de los estudios no estn listos durante la visita, tenga otra entrevista con su mdico para conocerlos.  Esta informacin no tiene Marine scientist el consejo del mdico. Asegrese de hacerle al mdico cualquier pregunta que tenga. Document Released: 06/15/2005 Document Revised: 04/05/2013 Document Reviewed: 01/12/2013 Elsevier Interactive Patient Education  2017 [employer].

## 2017-10-14 NOTE — Addendum Note (Signed)
Addended by: Thurnell Garbe A on: 10/14/2017 08:58 AM   Modules accepted: Orders

## 2017-10-14 NOTE — Telephone Encounter (Addendum)
Saw Dr. Marguerita Merles yesterday. Awaiting urine culture. Result still pending and patient was informed.  Patient complaining of pressure, feels like she has to go but when she urinates not a lot of urine.  Rosemarie Ax was speaking with patient and offered to check with Dr. Loetta Rough regarding her symptoms but she declined stating she wanted to wait on urine culture result. Rosemarie Ax advised her there is a doctor on call if she needs [place] over the long weekend.

## 2017-10-17 LAB — URINALYSIS, COMPLETE W/RFL CULTURE
BILIRUBIN URINE: NEGATIVE
Glucose, UA: NEGATIVE
Hyaline Cast: NONE SEEN /LPF
KETONES UR: NEGATIVE
LEUKOCYTE ESTERASE: NEGATIVE
NITRITES URINE, INITIAL: NEGATIVE
PH: 7.5 (ref 5.0–8.0)
SPECIFIC GRAVITY, URINE: 1.02 (ref 1.001–1.03)
WBC, UA: NONE SEEN /HPF (ref 0–5)

## 2017-10-17 LAB — URINE CULTURE
MICRO NUMBER:: 90483388
SPECIMEN QUALITY:: ADEQUATE

## 2017-10-17 LAB — CULTURE INDICATED

## 2017-10-18 ENCOUNTER — Telehealth: Payer: Self-pay | Admitting: *Deleted

## 2017-10-18 DIAGNOSIS — Z0289 Encounter for other administrative examinations: Secondary | ICD-10-CM

## 2017-10-18 NOTE — Telephone Encounter (Signed)
Spanish speaking patient called Chelsea Barrett regarding abnormal urine results on 10/14/17. Please advise

## 2017-10-19 MED ORDER — SULFAMETHOXAZOLE-TRIMETHOPRIM 800-160 MG PO TABS: 1.0000 | ORAL_TABLET | Freq: Two times a day (BID) | ORAL | 0 refills | Status: DC

## 2017-10-19 NOTE — Telephone Encounter (Signed)
Urine culture E. Coli positive.  Treat with Bactrim DS 1 tab PO BID x 3 days.

## 2017-10-19 NOTE — Telephone Encounter (Signed)
Chelsea Barrett will call and relay, Rx sent.

## 2017-10-26 ENCOUNTER — Encounter: Payer: Self-pay | Admitting: Anesthesiology

## 2017-11-02 ENCOUNTER — Other Ambulatory Visit: Payer: 59

## 2017-11-02 ENCOUNTER — Ambulatory Visit: Payer: 59 | Admitting: Obstetrics & Gynecology

## 2017-11-03 ENCOUNTER — Encounter (HOSPITAL_BASED_OUTPATIENT_CLINIC_OR_DEPARTMENT_OTHER): Payer: Self-pay

## 2017-11-05 ENCOUNTER — Encounter (HOSPITAL_BASED_OUTPATIENT_CLINIC_OR_DEPARTMENT_OTHER): Payer: Self-pay

## 2017-11-05 ENCOUNTER — Other Ambulatory Visit: Payer: Self-pay

## 2017-11-05 NOTE — Progress Notes (Signed)
Spoke with:  Chelsea Barrett NPO:  After Midnight, no gum, candy, or mints   Arrival time:  [id number] Labs: CBC 11/09/2017 in epic AM medications:  None Pre op orders:  Yes Ride home:  Chelsea Barrett (husband) [phone]

## 2017-11-09 ENCOUNTER — Encounter (HOSPITAL_COMMUNITY)
Admission: RE | Admit: 2017-11-09 | Discharge: 2017-11-09 | Disposition: A | Discharge status: Home / Self Care | Payer: 59 | Source: Ambulatory Visit | Attending: Obstetrics & Gynecology | Admitting: Obstetrics & Gynecology

## 2017-11-09 DIAGNOSIS — Z8042 Family history of malignant neoplasm of prostate: Secondary | ICD-10-CM | POA: Diagnosis not present

## 2017-11-09 DIAGNOSIS — D649 Anemia, unspecified: Secondary | ICD-10-CM | POA: Diagnosis not present

## 2017-11-09 DIAGNOSIS — Z7951 Long term (current) use of inhaled steroids: Secondary | ICD-10-CM | POA: Diagnosis not present

## 2017-11-09 DIAGNOSIS — Z807 Family history of other malignant neoplasms of lymphoid, hematopoietic and related tissues: Secondary | ICD-10-CM | POA: Diagnosis not present

## 2017-11-09 DIAGNOSIS — Z79899 Other long term (current) drug therapy: Secondary | ICD-10-CM | POA: Diagnosis not present

## 2017-11-09 DIAGNOSIS — Z825 Family history of asthma and other chronic lower respiratory diseases: Secondary | ICD-10-CM | POA: Diagnosis not present

## 2017-11-09 DIAGNOSIS — J45909 Unspecified asthma, uncomplicated: Secondary | ICD-10-CM | POA: Diagnosis not present

## 2017-11-09 DIAGNOSIS — R350 Frequency of micturition: Secondary | ICD-10-CM | POA: Diagnosis not present

## 2017-11-09 DIAGNOSIS — N840 Polyp of corpus uteri: Secondary | ICD-10-CM | POA: Diagnosis not present

## 2017-11-09 DIAGNOSIS — M5126 Other intervertebral disc displacement, lumbar region: Secondary | ICD-10-CM | POA: Diagnosis not present

## 2017-11-09 DIAGNOSIS — E559 Vitamin D deficiency, unspecified: Secondary | ICD-10-CM | POA: Diagnosis not present

## 2017-11-09 LAB — CBC
HCT: 40.9 % (ref 36.0–46.0)
Hemoglobin: 13.1 g/dL (ref 12.0–15.0)
MCH: 27.6 pg (ref 26.0–34.0)
MCHC: 32 g/dL (ref 30.0–36.0)
MCV: 86.1 fL (ref 78.0–100.0)
PLATELETS: 267 10*3/uL (ref 150–400)
RBC: 4.75 MIL/uL (ref 3.87–5.11)
RDW: 12.9 % (ref 11.5–15.5)
WBC: 6.3 10*3/uL (ref 4.0–10.5)

## 2017-11-10 ENCOUNTER — Ambulatory Visit (HOSPITAL_BASED_OUTPATIENT_CLINIC_OR_DEPARTMENT_OTHER): Payer: 59 | Admitting: Anesthesiology

## 2017-11-10 ENCOUNTER — Other Ambulatory Visit: Payer: 59

## 2017-11-10 ENCOUNTER — Other Ambulatory Visit: Payer: Self-pay

## 2017-11-10 ENCOUNTER — Encounter (HOSPITAL_BASED_OUTPATIENT_CLINIC_OR_DEPARTMENT_OTHER): Payer: Self-pay | Admitting: *Deleted

## 2017-11-10 ENCOUNTER — Ambulatory Visit: Payer: 59 | Admitting: Obstetrics & Gynecology

## 2017-11-10 ENCOUNTER — Ambulatory Visit (HOSPITAL_BASED_OUTPATIENT_CLINIC_OR_DEPARTMENT_OTHER)
Admission: RE | Admit: 2017-11-10 | Discharge: 2017-11-10 | Disposition: A | Discharge status: Home / Self Care | Payer: 59 | Source: Ambulatory Visit | Attending: Obstetrics & Gynecology | Admitting: Obstetrics & Gynecology

## 2017-11-10 ENCOUNTER — Encounter (HOSPITAL_BASED_OUTPATIENT_CLINIC_OR_DEPARTMENT_OTHER)
Admission: RE | Disposition: A | Discharge status: Home / Self Care | Payer: Self-pay | Source: Ambulatory Visit | Attending: Obstetrics & Gynecology

## 2017-11-10 DIAGNOSIS — Z825 Family history of asthma and other chronic lower respiratory diseases: Secondary | ICD-10-CM | POA: Insufficient documentation

## 2017-11-10 DIAGNOSIS — J45909 Unspecified asthma, uncomplicated: Secondary | ICD-10-CM | POA: Insufficient documentation

## 2017-11-10 DIAGNOSIS — Z7951 Long term (current) use of inhaled steroids: Secondary | ICD-10-CM | POA: Insufficient documentation

## 2017-11-10 DIAGNOSIS — E559 Vitamin D deficiency, unspecified: Secondary | ICD-10-CM | POA: Insufficient documentation

## 2017-11-10 DIAGNOSIS — N840 Polyp of corpus uteri: Secondary | ICD-10-CM | POA: Diagnosis not present

## 2017-11-10 DIAGNOSIS — N8500 Endometrial hyperplasia, unspecified: Secondary | ICD-10-CM | POA: Diagnosis not present

## 2017-11-10 DIAGNOSIS — D649 Anemia, unspecified: Secondary | ICD-10-CM | POA: Insufficient documentation

## 2017-11-10 DIAGNOSIS — Z807 Family history of other malignant neoplasms of lymphoid, hematopoietic and related tissues: Secondary | ICD-10-CM | POA: Insufficient documentation

## 2017-11-10 DIAGNOSIS — R350 Frequency of micturition: Secondary | ICD-10-CM | POA: Insufficient documentation

## 2017-11-10 DIAGNOSIS — M5126 Other intervertebral disc displacement, lumbar region: Secondary | ICD-10-CM | POA: Insufficient documentation

## 2017-11-10 DIAGNOSIS — Z8042 Family history of malignant neoplasm of prostate: Secondary | ICD-10-CM | POA: Insufficient documentation

## 2017-11-10 DIAGNOSIS — Z79899 Other long term (current) drug therapy: Secondary | ICD-10-CM | POA: Insufficient documentation

## 2017-11-10 HISTORY — PX: DILATATION & CURETTAGE/HYSTEROSCOPY WITH MYOSURE: SHX6511

## 2017-11-10 HISTORY — DX: Polyp of corpus uteri: N84.0

## 2017-11-10 HISTORY — DX: Pain in left lower leg: M79.662

## 2017-11-10 HISTORY — DX: Unspecified asthma, uncomplicated: J45.909

## 2017-11-10 HISTORY — DX: Pain in right lower leg: M79.661

## 2017-11-10 HISTORY — DX: Frequency of micturition: R35.0

## 2017-11-10 HISTORY — DX: Anemia, unspecified: D64.9

## 2017-11-10 LAB — POCT PREGNANCY, URINE: PREG TEST UR: NEGATIVE

## 2017-11-10 SURGERY — DILATATION & CURETTAGE/HYSTEROSCOPY WITH MYOSURE
Anesthesia: General | Site: Uterus

## 2017-11-10 MED ORDER — KETOROLAC TROMETHAMINE 30 MG/ML IJ SOLN
INTRAMUSCULAR | Status: AC
  Filled 2017-11-10: qty 1

## 2017-11-10 MED ORDER — SODIUM CHLORIDE 0.9 % IR SOLN
Status: DC | PRN
  Administered 2017-11-10: 1800 mL

## 2017-11-10 MED ORDER — OXYCODONE HCL 5 MG PO TABS
5.0000 mg | ORAL_TABLET | Freq: Once | ORAL | Status: AC | PRN
  Administered 2017-11-10: 5 mg via ORAL
  Filled 2017-11-10: qty 1

## 2017-11-10 MED ORDER — FENTANYL CITRATE (PF) 100 MCG/2ML IJ SOLN
INTRAMUSCULAR | Status: AC
  Filled 2017-11-10: qty 2

## 2017-11-10 MED ORDER — OXYCODONE HCL 5 MG/5ML PO SOLN
5.0000 mg | Freq: Once | ORAL | Status: AC | PRN
  Filled 2017-11-10: qty 5

## 2017-11-10 MED ORDER — DEXAMETHASONE SODIUM PHOSPHATE 10 MG/ML IJ SOLN
INTRAMUSCULAR | Status: AC
  Filled 2017-11-10: qty 1

## 2017-11-10 MED ORDER — MIDAZOLAM HCL 2 MG/2ML IJ SOLN
INTRAMUSCULAR | Status: AC
  Filled 2017-11-10: qty 2

## 2017-11-10 MED ORDER — ONDANSETRON HCL 4 MG/2ML IJ SOLN
4.0000 mg | Freq: Four times a day (QID) | INTRAMUSCULAR | Status: DC | PRN
  Filled 2017-11-10: qty 2

## 2017-11-10 MED ORDER — LIDOCAINE HCL 1 % IJ SOLN
INTRAMUSCULAR | Status: DC | PRN
  Administered 2017-11-10: 20 mL

## 2017-11-10 MED ORDER — ONDANSETRON HCL 4 MG/2ML IJ SOLN
INTRAMUSCULAR | Status: AC
  Filled 2017-11-10: qty 2

## 2017-11-10 MED ORDER — KETOROLAC TROMETHAMINE 30 MG/ML IJ SOLN
INTRAMUSCULAR | Status: DC | PRN
  Administered 2017-11-10: 30 mg via INTRAVENOUS

## 2017-11-10 MED ORDER — LIDOCAINE 2% (20 MG/ML) 5 ML SYRINGE
INTRAMUSCULAR | Status: AC
  Filled 2017-11-10: qty 5

## 2017-11-10 MED ORDER — ONDANSETRON HCL 4 MG/2ML IJ SOLN
INTRAMUSCULAR | Status: DC | PRN
  Administered 2017-11-10: 4 mg via INTRAVENOUS

## 2017-11-10 MED ORDER — OXYCODONE HCL 5 MG PO TABS
ORAL_TABLET | ORAL | Status: AC
  Filled 2017-11-10: qty 1

## 2017-11-10 MED ORDER — PROPOFOL 10 MG/ML IV BOLUS
INTRAVENOUS | Status: DC | PRN
  Administered 2017-11-10: 150 mg via INTRAVENOUS

## 2017-11-10 MED ORDER — PROPOFOL 10 MG/ML IV BOLUS
INTRAVENOUS | Status: AC
  Filled 2017-11-10: qty 20

## 2017-11-10 MED ORDER — DEXAMETHASONE SODIUM PHOSPHATE 4 MG/ML IJ SOLN
INTRAMUSCULAR | Status: DC | PRN
  Administered 2017-11-10: 10 mg via INTRAVENOUS

## 2017-11-10 MED ORDER — LACTATED RINGERS IV SOLN
INTRAVENOUS | Status: DC
  Administered 2017-11-10 (×2): via INTRAVENOUS
  Filled 2017-11-10: qty 1000

## 2017-11-10 MED ORDER — CEFAZOLIN SODIUM-DEXTROSE 2-4 GM/100ML-% IV SOLN
2.0000 g | INTRAVENOUS | Status: AC
  Administered 2017-11-10: 2 g via INTRAVENOUS
  Filled 2017-11-10: qty 100

## 2017-11-10 MED ORDER — LIDOCAINE 2% (20 MG/ML) 5 ML SYRINGE
INTRAMUSCULAR | Status: DC | PRN
  Administered 2017-11-10: 60 mg via INTRAVENOUS

## 2017-11-10 MED ORDER — CEFAZOLIN SODIUM-DEXTROSE 2-4 GM/100ML-% IV SOLN
INTRAVENOUS | Status: AC
  Filled 2017-11-10: qty 100

## 2017-11-10 MED ORDER — LIDOCAINE HCL 1 % IJ SOLN
INTRAMUSCULAR | Status: AC
  Filled 2017-11-10: qty 20

## 2017-11-10 MED ORDER — FENTANYL CITRATE (PF) 100 MCG/2ML IJ SOLN
INTRAMUSCULAR | Status: DC | PRN
  Administered 2017-11-10: 50 ug via INTRAVENOUS

## 2017-11-10 MED ORDER — MIDAZOLAM HCL 5 MG/5ML IJ SOLN
INTRAMUSCULAR | Status: DC | PRN
  Administered 2017-11-10: 2 mg via INTRAVENOUS

## 2017-11-10 MED ORDER — FENTANYL CITRATE (PF) 100 MCG/2ML IJ SOLN
25.0000 ug | INTRAMUSCULAR | Status: DC | PRN
  Filled 2017-11-10: qty 1

## 2017-11-10 SURGICAL SUPPLY — 23 items
CANISTER SUCT 3000ML PPV (MISCELLANEOUS) ×2 IMPLANT
CATH ROBINSON RED A/P 16FR (CATHETERS) ×2 IMPLANT
COUNTER NEEDLE 1200 MAGNETIC (NEEDLE) IMPLANT
DEVICE MYOSURE LITE (MISCELLANEOUS) ×2 IMPLANT
DEVICE MYOSURE REACH (MISCELLANEOUS) IMPLANT
DILATOR CANAL MILEX (MISCELLANEOUS) IMPLANT
ELECT REM PT RETURN 9FT ADLT (ELECTROSURGICAL) ×2
ELECTRODE REM PT RTRN 9FT ADLT (ELECTROSURGICAL) ×1 IMPLANT
FILTER ARTHROSCOPY CONVERTOR (FILTER) ×2 IMPLANT
GLOVE BIO SURGEON STRL SZ 6.5 (GLOVE) ×2 IMPLANT
GLOVE BIOGEL PI IND STRL 7.0 (GLOVE) ×2 IMPLANT
GLOVE BIOGEL PI INDICATOR 7.0 (GLOVE) ×2
GOWN STRL REUS W/TWL LRG LVL3 (GOWN DISPOSABLE) ×4 IMPLANT
IV NS IRRIG 3000ML ARTHROMATIC (IV SOLUTION) ×2 IMPLANT
MYOSURE XL FIBROID REM (MISCELLANEOUS)
PACK VAGINAL MINOR WOMEN LF (CUSTOM PROCEDURE TRAY) ×2 IMPLANT
PAD OB MATERNITY 4.3X12.25 (PERSONAL CARE ITEMS) ×2 IMPLANT
PAD PREP 24X48 CUFFED NSTRL (MISCELLANEOUS) ×2 IMPLANT
SEAL ROD LENS SCOPE MYOSURE (ABLATOR) ×2 IMPLANT
SYSTEM TISS REMOVAL MYSR XL RM (MISCELLANEOUS) IMPLANT
TOWEL OR 17X24 6PK STRL BLUE (TOWEL DISPOSABLE) ×4 IMPLANT
TUBING AQUILEX INFLOW (TUBING) ×2 IMPLANT
TUBING AQUILEX OUTFLOW (TUBING) ×2 IMPLANT

## 2017-11-10 NOTE — H&P (Signed)
Chelsea Barrett is an 52 y.o. female G0  RP: Endometrial Polyp for HSC/Myosure excision, D+C   HPI: Menopausal, on no hormone replacement therapy.  No vaginal bleeding since November 2018.  Intra-uterine Polyp measuring 2.1 cm confirmed by sonohysterogram.  Pertinent Gynecological History: Contraception: post menopausal status Blood transfusions: none Sexually transmitted diseases: no past history Last mammogram: normal  Last pap: normal  OB History: G0   Menstrual History: Patient's last menstrual period was 10/21/2017 (approximate).    Past Medical History:  Diagnosis Date  . Anemia   . Chalazion of both eyes   . Endometrial polyp   . Endometriosis   . Lumbar herniated disc    L5  . Pain in both lower legs   . Reactive airway disease    exposure to sand blasting  . Urinary frequency   . Vitamin D deficiency     Past Surgical History:  Procedure Laterality Date  . BREAST BIOPSY  2008   No malignancy  . DILATION AND CURETTAGE OF UTERUS  2005  . PELVIC LAPAROSCOPY      Family History  Problem Relation Age of Onset  . Prostate cancer Father 34       Deceased  . Lymphoma Mother 64       Neck-Deceased  . COPD Maternal Grandmother   . Healthy Brother        x5  . Healthy Sister        x8  . Breast cancer Neg Hx     Social History:  reports that she has never smoked. She has never used smokeless tobacco. She reports that she does not drink alcohol or use drugs.  Allergies: No Known Allergies  Medications Prior to Admission  Medication Sig Dispense Refill Last Dose  . thiamine (VITAMIN B-1) 100 MG tablet Take 100 mg by mouth daily.     . vitamin E 100 UNIT capsule Take by mouth daily.     Marland Kitchen albuterol (PROVENTIL HFA;VENTOLIN HFA) 108 (90 Base) MCG/ACT inhaler Inhale 1-2 puffs into the lungs every 6 (six) hours as needed for wheezing or shortness of breath. 1 Inhaler 0 Taking  . albuterol (PROVENTIL) (2.5 MG/3ML) 0.083% nebulizer solution Take 3 mLs (2.5 mg  total) by nebulization every 6 (six) hours as needed for wheezing or shortness of breath. 150 mL 1 Taking  . Ascorbic Acid (VITAMIN C) 100 MG tablet Take 100 mg by mouth daily.   Taking  . budesonide-formoterol (SYMBICORT) 160-4.5 MCG/ACT inhaler Inhale 2 puffs into the lungs 2 (two) times daily. 1 Inhaler 0 Taking  . fluticasone (FLONASE) 50 MCG/ACT nasal spray Place 2 sprays into both nostrils daily. (Patient taking differently: Place 2 sprays into both nostrils as needed. ) 16 g 1 Taking  . medroxyPROGESTERone (PROVERA) 5 MG tablet Take 1 tablet (5 mg total) by mouth daily for 7 days. Cyclic Provera daily x 7 days every 3 months when no period 7 tablet 4 Taking  . Multiple Vitamin (MULTIVITAMIN) tablet Take 1 tablet by mouth daily.   Taking  . sulfamethoxazole-trimethoprim (BACTRIM DS,SEPTRA DS) 800-160 MG tablet Take 1 tablet by mouth 2 (two) times daily. 6 tablet 0     REVIEW OF SYSTEMS: A ROS was performed and pertinent positives and negatives are included in the history.  GENERAL: No fevers or chills. HEENT: No change in vision, no earache, sore throat or sinus congestion. NECK: No pain or stiffness. CARDIOVASCULAR: No chest pain or pressure. No palpitations. PULMONARY: No shortness of breath, cough  or wheeze. GASTROINTESTINAL: No abdominal pain, nausea, vomiting or diarrhea, melena or bright red blood per rectum. GENITOURINARY: No urinary frequency, urgency, hesitancy or dysuria. MUSCULOSKELETAL: No joint or muscle pain, no back pain, no recent trauma. DERMATOLOGIC: No rash, no itching, no lesions. ENDOCRINE: No polyuria, polydipsia, no heat or cold intolerance. No recent change in weight. HEMATOLOGICAL: No anemia or easy bruising or bleeding. NEUROLOGIC: No headache, seizures, numbness, tingling or weakness. PSYCHIATRIC: No depression, no loss of interest in normal activity or change in sleep pattern.     Blood pressure 116/85, pulse 72, temperature 98.6 F (37 C), temperature source Oral,  resp. rate 16, height 5\' 2"  (1.575 m), weight 124 lb 12.8 oz (56.6 kg), last menstrual period 10/21/2017, SpO2 100 %.  Physical Exam:  See office notes   Results for orders placed or performed during the hospital encounter of 11/10/17 (from the past 24 hour(s))  CBC     Status: None   Collection Time: 11/09/17 10:20 AM  Result Value Ref Range   WBC 6.3 4.0 - 10.5 K/uL   RBC 4.75 3.87 - 5.11 MIL/uL   Hemoglobin 13.1 12.0 - 15.0 g/dL   HCT 40.9 36.0 - 46.0 %   MCV 86.1 78.0 - 100.0 fL   MCH 27.6 26.0 - 34.0 pg   MCHC 32.0 30.0 - 36.0 g/dL   RDW 12.9 11.5 - 15.5 %   Platelets 267 150 - 400 K/uL   Sonohysterogram on 10/13/2017:  Following injection of saline the endometrium is filled, showing a defect at the posterior fundus measuring 2.1 x 1.5 x 1.6 cm.   Assessment/Plan:  52 y.o. G0P0000   1. 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  We will proceed with hysteroscopy, MyoSure resection and D&C.  Surgery and risks reviewed with patient.  Pamphlet given.                         Patient was counseled as to the risk of surgery to include the following:  1. Infection (prohylactic antibiotics will be administered)  2. DVT/Pulmonary Embolism (prophylactic pneumo compression stockings will be used)  3.Trauma to internal organs requiring additional surgical procedure to repair any injury to internal organs requiring perhaps additional hospitalization days.  4.Hemmorhage requiring transfusion and blood products which carry risks such as anaphylactic reaction, hepatitis and AIDS  Patient had received literature information on the procedure scheduled and all her questions were answered and fully accepts all risk.   Marie-Lyne Braiden Rodman 11/10/2017, 8:28 AM

## 2017-11-10 NOTE — Discharge Instructions (Addendum)
Histeroscopa - Cuidados posteriores (Hysteroscopy, Care After) Siga estas instrucciones durante las prximas semanas. Estas indicaciones le proporcionan informacin general acerca de cmo deber cuidarse despus del procedimiento. El mdico tambin podr darle instrucciones ms especficas. El tratamiento se ha planificado de acuerdo a las prcticas mdicas actuales, pero a veces se producen problemas. Comunquese con el mdico si tiene algn problema o tiene dudas despus del procedimiento. QU ESPERAR DESPUS DEL PROCEDIMIENTO Despus del procedimiento, es tpico tener las siguientes sensaciones:  Podr sentir clicos leves. Es normal que esto dure un par [employer].  Aumenta el sangrado. Puede variar desde un sangrado ligero durante un par de [employer] un sangrado similar al menstrual por 3 - 7 das. [place] durante los primeros 1-2 das despus del procedimiento.  Tome slo medicamentos de venta libre o recetados, segn las indicaciones del mdico. No tome aspirina. La aspirina puede aumentar el riesgo de sangrado.  Tome slo duchas y no baos durante 2 semanas, segn las indicaciones de su mdico.  No conduzca por 24 horas o siga las indicaciones de su mdico.  No beba alcohol si toma analgsicos.  No utilice tampones, duchas vaginales ni tenga relaciones sexuales durante 2 semanas o hasta que el profesional la autorice.  Tmese la [employer] por da, durante 4  5 das. Antela cada vez.  Siga las indicaciones de su mdico acerca de la dieta, la actividad fsica y levantar objetos pesados.  Si est constipada, usted puede: ? Tomar un laxante suave si su mdico la autoriza. ? Agregar salvado a su dieta. ? Beba suficiente lquido para mantener la orina clara o de color amarillo plido.  Pdale a alguna persona que permanezca con usted durante las primeras 24 a 48 horas, especialmente si le han administrado anestesia  general.  Concurra a las consultas de control con su mdico segn las indicaciones.  SOLICITE ATENCIN MDICA SI:  Se siente mareado o sufre un desmayo.  Tiene Higher education careers adviser (nuseas).  Tiene flujo vaginal anormal.  Tiene una erupcin.  Aumenta el dolor y no puede controlarlo con Conservation officer, nature.  SOLICITE ATENCIN MDICA DE INMEDIATO SI:  Tiene cogulos de sangre o una hemorragia ms abundante que un perodo menstrual normal.  Tiene fiebre.  Aumenta el dolor y no puede controlarlo con Conservation officer, nature.  Siente un dolor nuevo en el vientre (abdominal).  Se desmaya.  Tiene dolor en los hombros (en la zona donde van los breteles).  Le falta el aire.  Esta informacin no tiene Marine scientist el consejo del mdico. Asegrese de hacerle al mdico cualquier pregunta que tenga. Document Released: 04/05/2013 Document Revised: 04/05/2013 Document Reviewed: 01/12/2013 Elsevier Interactive Patient Education  2017 [place] [facility] Instructions  Activity: Get plenty of rest for the remainder of the day. A responsible individual must stay with you for 24 hours following the procedure.  For the next 24 hours, DO NOT: -Drive a car -Paediatric nurse -Drink alcoholic beverages -Take any medication unless instructed by your physician -Make any legal decisions or sign important papers.  Meals: Start with liquid foods such as gelatin or soup. Progress to regular foods as tolerated. Avoid greasy, spicy, heavy foods. If nausea and/or vomiting occur, drink only clear liquids until the nausea and/or vomiting subsides. Call your physician if vomiting continues.  Special Instructions/Symptoms: Your throat may feel dry or sore from the anesthesia or the breathing tube placed in your throat during surgery. If  this causes discomfort, gargle with warm salt water. The discomfort should disappear within 24 hours.  May take Advil, Motrin, or Ibuprofen after 4:30  PM.

## 2017-11-10 NOTE — Anesthesia Procedure Notes (Signed)
Procedure Name: LMA Insertion Date/Time: 11/10/2017 9:58 AM Performed by: Albertha Ghee, MD Pre-anesthesia Checklist: Patient identified, Emergency Drugs available, Suction available and Patient being monitored Patient Re-evaluated:Patient Re-evaluated prior to induction Oxygen Delivery Method: Circle system utilized Preoxygenation: Pre-oxygenation with 100% oxygen Induction Type: IV induction Ventilation: Mask ventilation without difficulty LMA: LMA inserted LMA Size: 4.0 Number of attempts: 1 Airway Equipment and Method: Bite block Placement Confirmation: positive ETCO2 Tube secured with: Tape Dental Injury: Teeth and Oropharynx as per pre-operative assessment

## 2017-11-10 NOTE — Op Note (Signed)
Operative Note  11/10/2017  10:36 AM  PATIENT:  Chelsea Barrett  52 y.o. female  PRE-OPERATIVE DIAGNOSIS:  Submucous Myoma or Endometrial Polyp  POST-OPERATIVE DIAGNOSIS:  Endometrial Polyps  PROCEDURE:  Procedure(s): HYSTEROSCOPY WITH MYOSURE EXCISION, DILATATION & CURETTAGE   SURGEON:  Surgeon(s): Princess Bruins, MD  ANESTHESIA:   general  FINDINGS: 2 small Endometrial Polyps at posterior fundal area  DESCRIPTION OF OPERATION: Under general anesthesia with laryngeal mask, the patient is in lithotomy position.  She is prepped with Betadine on the suprapubic, vulvar and vaginal areas.  The bladder is catheterized.  The patient is draped as usual.  The timeout is done.  The vaginal exam reveals an anteverted uterus, normal volume.  No adnexal mass.  The speculum is inserted in the vagina and the anterior lip of the cervix is grasped with a tenaculum.  A paracervical block is done with Xylocaine 1% a total of 20 cc at 4 and 8:00.  Dilation of the cervix with Pratt dilators up to #21 without difficulty.  The hysteroscope is easily inserted in the intrauterine cavity and inspection is done.  A 1.5 cm polyp with a second smaller about 0.5 cm polyp are visualized at the posterior fundal aspect of the intrauterine cavity.  They both appear benign with no increased vascularization.  The rest of the cavity is normal with both ostia well seen.  Pictures are taken.  The light Myosure instrument is inserted through the hysteroscope.  Complete excision of both endometrial polyps.  Pictures are taken after the excision.  Hemostasis is adequate.  The hysteroscope with MyoSure are removed from the intrauterine cavity.  We proceeded with a systematic curettage of the intrauterine cavity on all surfaces with a sharp curette.  Both specimens are sent together to pathology.  All instruments are removed.  Hemostasis is adequate at the level of the cervix.  The speculum is removed.  The patient is brought to  recovery room in good and stable status.  ESTIMATED BLOOD LOSS: 10 mL The fluid deficit was 195 cc  Intake/Output Summary (Last 24 hours) at 11/10/2017 1036 Last data filed at 11/10/2017 1013 Gross per 24 hour  Intake 300 ml  Output 85 ml  Net 215 ml     BLOOD ADMINISTERED:none   LOCAL MEDICATIONS USED:  XYLOCAINE 1% 20 cc for Paracervical block  SPECIMEN:  Source of Specimen:  Resection material of Endometrial Polyps and Endometrial Curettings  DISPOSITION OF SPECIMEN:  PATHOLOGY  COUNTS:  YES  PLAN OF CARE: Transfer to PACU  Marie-Lyne LavoieMD10:36 AM

## 2017-11-10 NOTE — Anesthesia Preprocedure Evaluation (Signed)
Anesthesia Evaluation  Patient identified by MRN, date of birth, ID band Patient awake    Reviewed: Allergy & Precautions, H&P , NPO status , Patient's Chart, lab work & pertinent test results  Airway Mallampati: II   Neck ROM: full    Dental   Pulmonary  Reactive airway diz   breath sounds clear to auscultation       Cardiovascular negative cardio ROS   Rhythm:regular Rate:Normal     Neuro/Psych    GI/Hepatic   Endo/Other    Renal/GU      Musculoskeletal   Abdominal   Peds  Hematology   Anesthesia Other Findings   Reproductive/Obstetrics                             Anesthesia Physical Anesthesia Plan  ASA: II  Anesthesia Plan: General   Post-op Pain Management:    Induction: Intravenous  PONV Risk Score and Plan: 3 and Ondansetron, Dexamethasone, Midazolam and Treatment may vary due to age or medical condition  Airway Management Planned: LMA  Additional Equipment:   Intra-op Plan:   Post-operative Plan:   Informed Consent: I have reviewed the patients History and Physical, chart, labs and discussed the procedure including the risks, benefits and alternatives for the proposed anesthesia with the patient or authorized representative who has indicated his/her understanding and acceptance.     Plan Discussed with: Anesthesiologist, Surgeon and CRNA  Anesthesia Plan Comments:         Anesthesia Quick Evaluation

## 2017-11-10 NOTE — Transfer of Care (Signed)
  Last Vitals:  Vitals Value Taken Time  BP    Temp    Pulse 70 11/10/2017 10:41 AM  Resp 12 11/10/2017 10:41 AM  SpO2 100 % 11/10/2017 10:41 AM  Vitals shown include unvalidated device data.  Last Pain:  Vitals:   11/10/17 0904  TempSrc:   PainSc: 4       Patients Stated Pain Goal: 3 (11/10/17 0904)  Immediate Anesthesia Transfer of Care Note  Patient: Chelsea Barrett  Procedure(s) Performed: Procedure(s) (LRB): DILATATION & CURETTAGE/HYSTEROSCOPY WITH MYOSURE (N/A)  Patient Location: PACU  Anesthesia Type: General  Level of Consciousness: awake, alert  and oriented  Airway & Oxygen Therapy: Patient Spontanous Breathing and Patient connected to nasal cannula oxygen  Post-op Assessment: Report given to PACU RN and Post -op Vital signs reviewed and stable  Post vital signs: Reviewed and stable  Complications: No apparent anesthesia complications

## 2017-11-11 ENCOUNTER — Encounter (HOSPITAL_BASED_OUTPATIENT_CLINIC_OR_DEPARTMENT_OTHER): Payer: Self-pay | Admitting: Obstetrics & Gynecology

## 2017-11-11 NOTE — Anesthesia Postprocedure Evaluation (Signed)
Anesthesia Post Note  Patient: Chelsea Barrett  Procedure(s) Performed: DILATATION & CURETTAGE/HYSTEROSCOPY WITH MYOSURE (N/A Uterus)     Patient location during evaluation: PACU Anesthesia Type: General Level of consciousness: awake and alert Pain management: pain level controlled Vital Signs Assessment: post-procedure vital signs reviewed and stable Respiratory status: spontaneous breathing, nonlabored ventilation, respiratory function stable and patient connected to nasal cannula oxygen Cardiovascular status: blood pressure returned to baseline and stable Postop Assessment: no apparent nausea or vomiting Anesthetic complications: no    Last Vitals:  Vitals:   11/10/17 1115 11/10/17 1200  BP: 119/73 123/76  Pulse: (!) 57 62  Resp: 11 12  Temp:  36.8 C  SpO2: 100% 100%    Last Pain:  Vitals:   11/10/17 1215  TempSrc:   PainSc: [name]

## 2017-11-28 ENCOUNTER — Other Ambulatory Visit: Payer: Self-pay | Admitting: Medical

## 2017-11-29 ENCOUNTER — Encounter: Payer: Self-pay | Admitting: Anesthesiology

## 2017-11-29 DIAGNOSIS — H5213 Myopia, bilateral: Secondary | ICD-10-CM | POA: Diagnosis not present

## 2017-12-02 ENCOUNTER — Encounter: Payer: Self-pay | Admitting: Obstetrics & Gynecology

## 2017-12-02 ENCOUNTER — Telehealth: Payer: Self-pay

## 2017-12-02 ENCOUNTER — Ambulatory Visit (INDEPENDENT_AMBULATORY_CARE_PROVIDER_SITE_OTHER): Payer: 59 | Admitting: Obstetrics & Gynecology

## 2017-12-02 VITALS — BP 118/80 | Temp 98.7°F

## 2017-12-02 DIAGNOSIS — B3731 Acute candidiasis of vulva and vagina: Secondary | ICD-10-CM

## 2017-12-02 DIAGNOSIS — Z09 Encounter for follow-up examination after completed treatment for conditions other than malignant neoplasm: Secondary | ICD-10-CM

## 2017-12-02 DIAGNOSIS — B373 Candidiasis of vulva and vagina: Secondary | ICD-10-CM | POA: Diagnosis not present

## 2017-12-02 DIAGNOSIS — N9089 Other specified noninflammatory disorders of vulva and perineum: Secondary | ICD-10-CM

## 2017-12-02 MED ORDER — FLUCONAZOLE 150 MG PO TABS: 150.0000 mg | ORAL_TABLET | Freq: Once | ORAL | 2 refills | Status: AC

## 2017-12-02 MED ORDER — CLOBETASOL PROPIONATE 0.05 % EX OINT: 1.0000 "application " | TOPICAL_OINTMENT | Freq: Every day | CUTANEOUS | 1 refills | Status: AC

## 2017-12-02 MED ORDER — NORETHINDRONE 0.35 MG PO TABS: 1.0000 | ORAL_TABLET | Freq: Every day | ORAL | 4 refills | Status: DC

## 2017-12-02 NOTE — Telephone Encounter (Signed)
Patient stopped at check out today and told Rosemarie Ax she needed to schedule a biopsy in one month.  CLaudia needs to know what type biopsy? And is one month correct? (she noticed pathology result mentioned 6 mos and she wanted to clarify.)

## 2017-12-02 NOTE — Patient Instructions (Signed)
1. Follow-up examination after gynecological surgery Good postop evolution with no complication.  Pathology discussed with patient, showing a polyp with simple hyperplasia and no atypia.  As a precaution decision to start on the progestin only pill until next annual exam in March 2020.  2. Yeast vaginitis Will treat with fluconazole 150 mg 1 tablet per mouth.  Usage reviewed.  3. Vulvar irritation Small area which looks like chronic inflammation.  Will try with clobetasol ointment daily for 1 months.  Recommend follow-up for vulvar biopsy if no improvement after a month.  Other orders - norethindrone (MICRONOR,CAMILA,ERRIN) 0.35 MG tablet; Take 1 tablet (0.35 mg total) by mouth daily. - fluconazole (DIFLUCAN) 150 MG tablet; Take 1 tablet (150 mg total) by mouth once for 1 dose. - clobetasol ointment (TEMOVATE) 0.05 %; Apply 1 application topically daily.  Denni, good seeing you today!

## 2017-12-02 NOTE — Progress Notes (Signed)
    Chelsea Barrett 20-Jan-1966 [id number]        52 y.o.  G0P0000   RP: Post op HSC/Excision of Polyp/D+C on 11/10/2017  HPI: Good postop evolution with no pelvic pain or vaginal bleeding.  No fever.  Complains of vaginal itching.  Urine and bowel movements normal.  Still feeling itchy at the left vulva after using clobetasol cream for 2 weeks.   OB History  Gravida Para Term Preterm AB Living  0 0 0 0 0 0  SAB TAB Ectopic Multiple Live Births  0 0 0 0      Past medical history,surgical history, problem list, medications, allergies, family history and social history were all reviewed and documented in the EPIC chart.   Directed ROS with pertinent positives and negatives documented in the history of present illness/assessment and plan.  Exam:  Vitals:   12/02/17 1429  BP: 118/80  Temp: 98.7 F (37.1 C)  TempSrc: Oral   General appearance:  Normal  Gynecologic exam: Right vulva normal.  Left mid vulva with a 1 cm area of irritation.  Bimanual exam: Uterus anteverted, normal volume, nontender.  No adnexal mass, nontender bilaterally.  Endometrial polyp, with curettage - ENDOMETRIAL POLYP WITH SIMPLE HYPERPLASIA. - NO ATYPIA OR MALIGNANCY.   Assessment/Plan:  52 y.o. G0P0000   1. Follow-up examination after gynecological surgery Good postop evolution with no complication.  Pathology discussed with patient, showing a polyp with simple hyperplasia and no atypia.  As a precaution decision to start on the progestin only pill until next annual exam in March 2020.  2. Yeast vaginitis Will treat with fluconazole 150 mg 1 tablet per mouth.  Usage reviewed.  3. Vulvar irritation Small area which looks like chronic inflammation.  Will try with clobetasol ointment daily for 1 months.  Recommend follow-up for vulvar biopsy if no improvement after a month.  Other orders - norethindrone (MICRONOR,CAMILA,ERRIN) 0.35 MG tablet; Take 1 tablet (0.35 mg total) by mouth daily. -  fluconazole (DIFLUCAN) 150 MG tablet; Take 1 tablet (150 mg total) by mouth once for 1 dose. - clobetasol ointment (TEMOVATE) 0.05 %; Apply 1 application topically daily.  Counseling on above issues and coordination of care more than 50% for 15 minutes.  Princess Bruins MD, 2:46 PM 12/02/2017

## 2017-12-02 NOTE — Telephone Encounter (Signed)
Biopsy of left vulva in 1 month if symptoms not improved on Clobetasol ointment.Marland Kitchen

## 2017-12-03 ENCOUNTER — Ambulatory Visit: Payer: 59 | Admitting: Obstetrics & Gynecology

## 2017-12-03 NOTE — Telephone Encounter (Signed)
Claudia informed patient. Appt scheduled.

## 2018-01-19 ENCOUNTER — Ambulatory Visit: Payer: 59 | Admitting: Obstetrics & Gynecology

## 2018-01-27 ENCOUNTER — Ambulatory Visit: Payer: 59 | Admitting: Obstetrics & Gynecology

## 2018-02-12 IMAGING — CR DG ELBOW COMPLETE 3+V*R*
4 series · 4 of 4 positions shown · non-contrast
Comparison: None.

CLINICAL DATA: Right elbow pain x2 months. Limited range of motion.

EXAM:
RIGHT ELBOW - COMPLETE 3+ VIEW

[x elbow joint ap right]
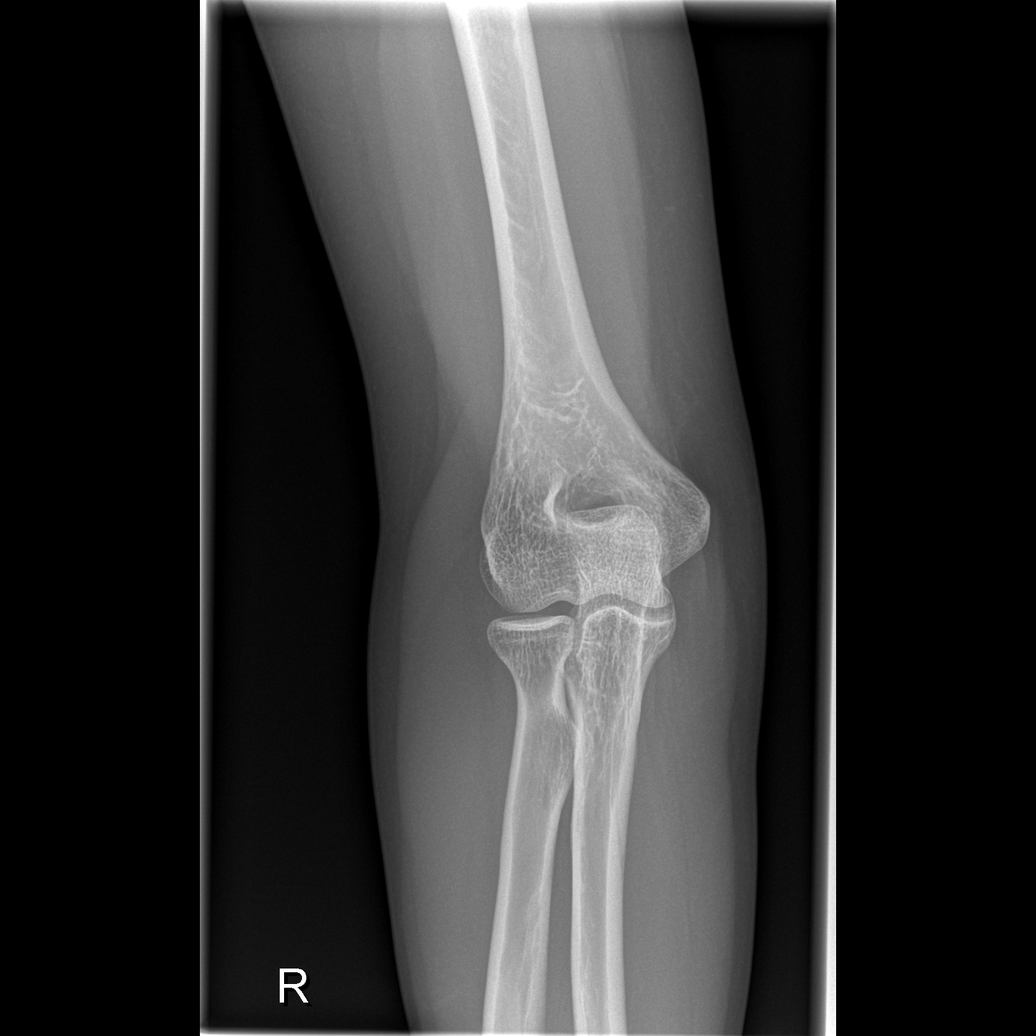

[x elbow joint obl. right (1 of 2)]
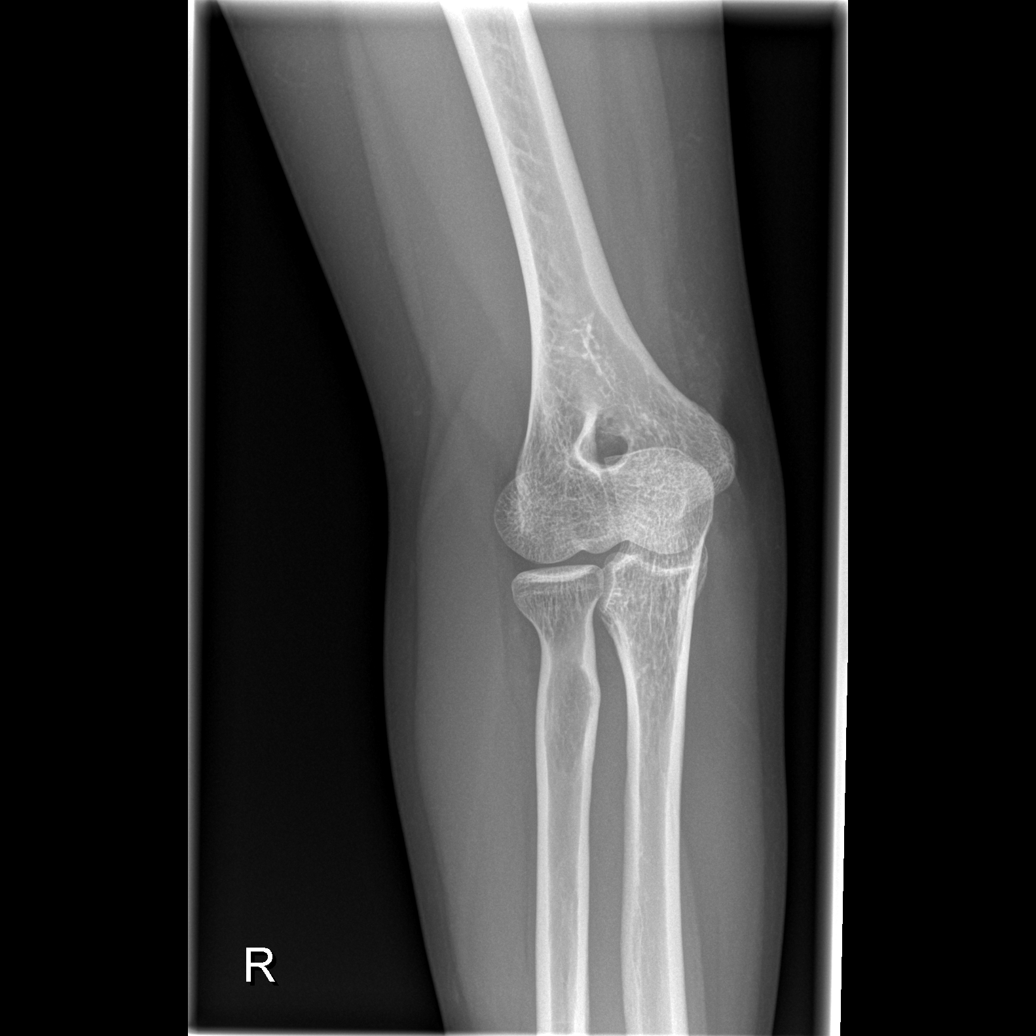

[x elbow joint obl. right (2 of 2)]
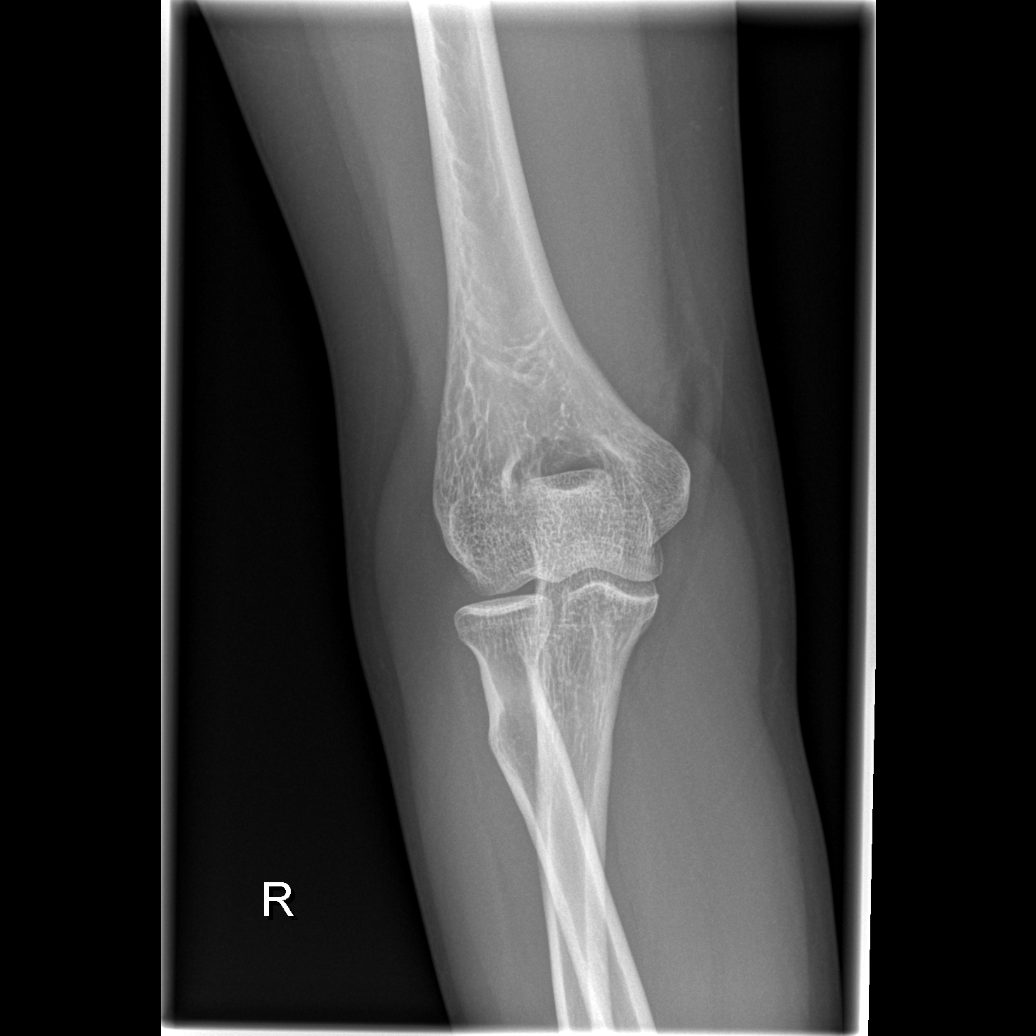

[x elbow joint lat right]
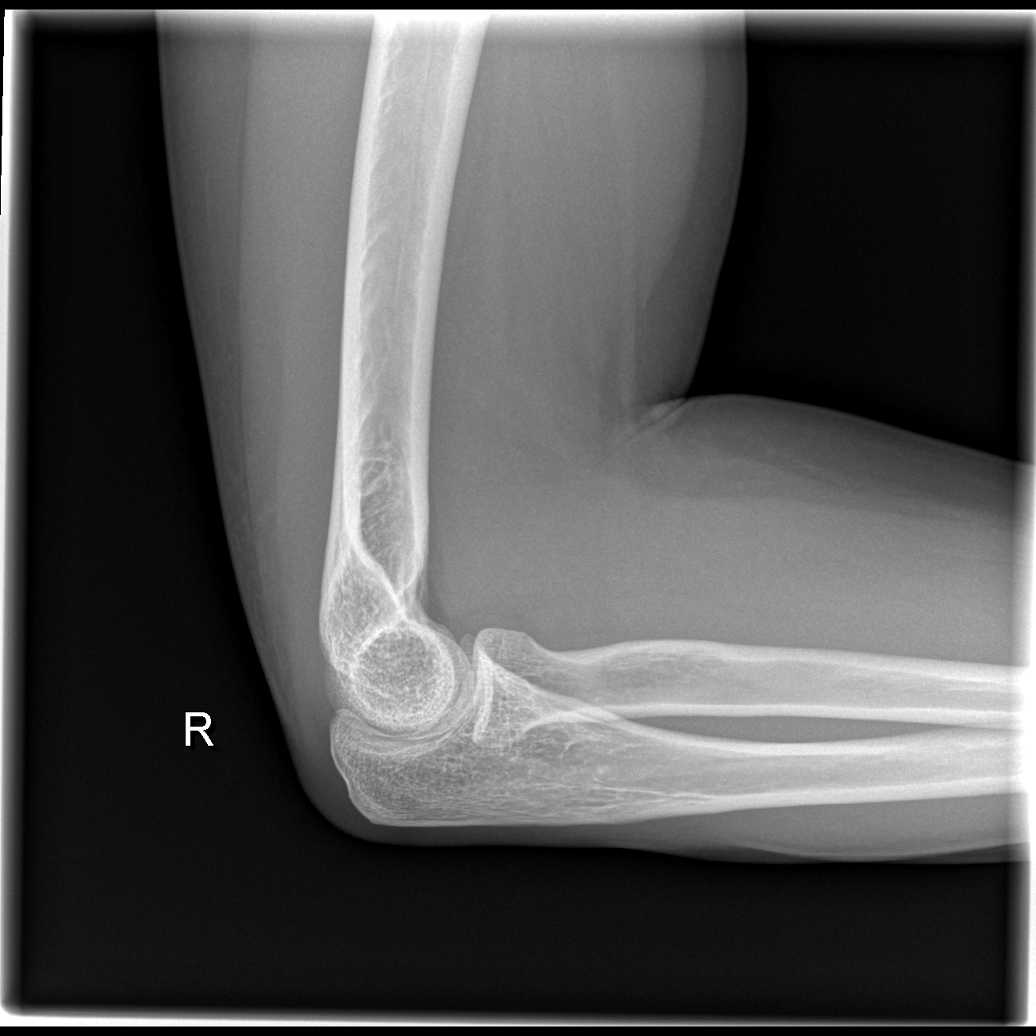

[4 of 4 positions shown; findings below may reference images not displayed]

FINDINGS: There is no evidence of fracture, dislocation, or joint effusion. No
intra-articular ossific loose bodies are noted. There is no evidence
of arthropathy or other focal bone abnormality. Soft tissues are
unremarkable.
IMPRESSION: No acute osseous abnormality or malalignment of the right elbow. No
joint effusion.

## 2018-05-16 ENCOUNTER — Ambulatory Visit: Payer: 59 | Admitting: Medical

## 2018-05-16 ENCOUNTER — Encounter: Payer: Self-pay | Admitting: Medical

## 2018-05-16 VITALS — BP 114/73 | HR 70 | Temp 97.9°F | Resp 16 | Ht 62.0 in | Wt 135.4 lb

## 2018-05-16 DIAGNOSIS — R21 Rash and other nonspecific skin eruption: Secondary | ICD-10-CM

## 2018-05-16 DIAGNOSIS — T7840XA Allergy, unspecified, initial encounter: Secondary | ICD-10-CM | POA: Diagnosis not present

## 2018-05-16 MED ORDER — HYDROXYZINE HCL 10 MG PO TABS
ORAL_TABLET | ORAL | 0 refills | Status: DC
Start: 2018-05-16 — End: 2018-09-07

## 2018-05-16 MED ORDER — METHYLPREDNISOLONE ACETATE 40 MG/ML IJ SUSP
40.0000 mg | Freq: Once | INTRAMUSCULAR | Status: AC
  Administered 2018-05-16: 40 mg via INTRAMUSCULAR

## 2018-05-16 MED ORDER — NYSTATIN-TRIAMCINOLONE 100000-0.1 UNIT/GM-% EX CREA: 1.0000 "application " | TOPICAL_CREAM | Freq: Two times a day (BID) | CUTANEOUS | 0 refills | Status: DC

## 2018-05-16 MED ORDER — PREDNISONE 10 MG PO TABS: ORAL_TABLET | ORAL | 0 refills | Status: DC

## 2018-05-16 NOTE — Patient Instructions (Signed)
You do appear to have recent allergic reaction but because not identified presently.  We gave you Depo-Medrol 40 mg injection today and prescribed oral 5-day taper to prednisone.  For itching I prescribed hydroxyzine.  Sedation is common side effect of hydroxyzine.  You do have some recent inguinal region or rash.  I am making nystatin available if that were to persist.  Please remember through the winter months to make sure that you moisturize your skin particularly if you are running your heat in the house a lot.  If rash response above measures but then returns randomly then would consider referral to allergist for testing to determine the cause.  Follow-up in 10 days or as needed.

## 2018-05-16 NOTE — Progress Notes (Signed)
Subjective:    Patient ID: Chelsea Barrett, female    DOB: 01-18-1966, 52 y.o.   MRN: [id number]  HPI  Pt in with arms, legs, upper thigh and groin/inguinal crease regions.   Pt not sure cause. No  new soaps, creams, detergents, food or lotions.   No suspicious outdoor exposure. No new animals. No chronic allergy/rash history. No hx of eczema.  Pt denies having itchy skin in winter when heat runs.    Review of Systems  Constitutional: Negative for chills, fatigue and fever.  Respiratory: Negative for cough, chest tightness, shortness of breath and wheezing.   Cardiovascular: Negative for chest pain and palpitations.  Gastrointestinal: Negative for abdominal distention, abdominal pain and blood in stool.  Musculoskeletal: Negative for back pain.  Skin: Positive for rash.  Neurological: Negative for dizziness, speech difficulty, weakness and headaches.  Hematological: Negative for adenopathy. Does not bruise/bleed easily.  Psychiatric/Behavioral: Negative for behavioral problems, dysphoric mood and suicidal ideas. The patient is not nervous/anxious and is not hyperactive.     Past Medical History:  Diagnosis Date  . Anemia   . Chalazion of both eyes   . Endometrial polyp   . Endometriosis   . Lumbar herniated disc    L5  . Pain in both lower legs   . Reactive airway disease    exposure to sand blasting  . Urinary frequency   . Vitamin D deficiency      Social History   Socioeconomic History  . Marital status: Married    Spouse name: Not on file  . Number of children: Not on file  . Years of education: Not on file  . Highest education level: Not on file  Occupational History  . Not on file  Social Needs  . Financial resource strain: Not on file  . Food insecurity:    Worry: Not on file    Inability: Not on file  . Transportation needs:    Medical: Not on file    Non-medical: Not on file  Tobacco Use  . Smoking status: Never Smoker  . Smokeless tobacco:  Never Used  Substance and Sexual Activity  . Alcohol use: Never    Alcohol/week: 0.0 standard drinks    Frequency: Never  . Drug use: Never  . Sexual activity: Yes    Partners: Male    Birth control/protection: None    Comment: 1ST INTERCOURSE- 45, PARTNERS- 3, married- 17 yrs   Lifestyle  . Physical activity:    Days per week: Not on file    Minutes per session: Not on file  . Stress: Not on file  Relationships  . Social connections:    Talks on phone: Not on file    Gets together: Not on file    Attends religious service: Not on file    Active member of club or organization: Not on file    Attends meetings of clubs or organizations: Not on file    Relationship status: Not on file  . Intimate partner violence:    Fear of current or ex partner: Not on file    Emotionally abused: Not on file    Physically abused: Not on file    Forced sexual activity: Not on file  Other Topics Concern  . Not on file  Social History Narrative  . Not on file    Past Surgical History:  Procedure Laterality Date  . BREAST BIOPSY  2008   No malignancy  . DILATATION & CURETTAGE/HYSTEROSCOPY WITH MYOSURE  N/A 11/10/2017   Procedure: DILATATION & CURETTAGE/HYSTEROSCOPY WITH MYOSURE;  Surgeon: Princess Bruins, MD;  Location: [place];  Service: Gynecology;  Laterality: N/A;  request to follow around 10:15am in [facility] time requests one hour  . DILATION AND CURETTAGE OF UTERUS  2005  . PELVIC LAPAROSCOPY      Family History  Problem Relation Age of Onset  . Prostate cancer Father 43       Deceased  . Lymphoma Mother 58       Neck-Deceased  . COPD Maternal Grandmother   . Healthy Brother        x5  . Healthy Sister        x8  . Breast cancer Neg Hx     No Known Allergies  Current Outpatient Medications on File Prior to Visit  Medication Sig Dispense Refill  . [DISCONTINUED] Norethindrone Acetate-Ethinyl Estradiol (JUNEL,LOESTRIN,MICROGESTIN) 1.5-30  MG-MCG tablet Take 1 tablet by mouth daily. 1 Package 11   No current facility-administered medications on file prior to visit.     BP 114/73   Pulse 70   Temp 97.9 F (36.6 C) (Oral)   Resp 16   Ht 5\' 2"  (1.575 m)   Wt 135 lb 6.4 oz (61.4 kg)   SpO2 100%   BMI 24.76 kg/m      Objective:   Physical Exam  General Mental Status- Alert. General Appearance- Not in acute distress.   Skin  Both arms medial aspect upper arm papular rash. Some on flank area and upper thigh.(shows me picture of thighs). Did not inspect groin area.  Neck Carotid Arteries- Normal color. Moisture- Normal Moisture. No carotid bruits. No JVD.  Chest and Lung Exam Auscultation: Breath Sounds:-Normal.  Cardiovascular Auscultation:Rythm- Regular. Murmurs & Other Heart Sounds:Auscultation of the heart reveals- No Murmurs.  Abdomen Inspection:-Inspeection Normal. Palpation/Percussion:Note:No mass. Palpation and Percussion of the abdomen reveal- Non Tender, Non Distended + BS, no rebound or guarding.   Neurologic Cranial Nerve exam:- CN III-XII intact(No nystagmus), symmetric smile. Strength:- 5/5 equal and symmetric strength both upper and lower extremities.      Assessment & Plan:  You do appear to have recent allergic reaction but because not identified presently.  We gave you Depo-Medrol 40 mg injection today and prescribed oral 5-day taper to prednisone.  For itching I prescribed hydroxyzine.  Sedation is common side effect of hydroxyzine.  You do have some recent inguinal region or rash.  I am making nystatin available if that were to persist.  Please remember through the winter months to make sure that you moisturize your skin particularly if you are running your heat in the house a lot.  If rash response above measures but then returns randomly then would consider referral to allergist for testing to determine the cause.  Follow-up in 10 days or as needed.  Mackie Pai, PA-C

## 2018-09-06 ENCOUNTER — Other Ambulatory Visit: Payer: Self-pay | Admitting: Obstetrics & Gynecology

## 2018-09-06 DIAGNOSIS — Z1231 Encounter for screening mammogram for malignant neoplasm of breast: Secondary | ICD-10-CM

## 2018-09-07 ENCOUNTER — Other Ambulatory Visit: Payer: Self-pay

## 2018-09-07 ENCOUNTER — Ambulatory Visit: Payer: 59 | Admitting: Women's Health

## 2018-09-07 ENCOUNTER — Telehealth: Payer: Self-pay | Admitting: *Deleted

## 2018-09-07 ENCOUNTER — Encounter: Payer: Self-pay | Admitting: Women's Health

## 2018-09-07 VITALS — BP 120/78

## 2018-09-07 DIAGNOSIS — N898 Other specified noninflammatory disorders of vagina: Secondary | ICD-10-CM

## 2018-09-07 DIAGNOSIS — N644 Mastodynia: Secondary | ICD-10-CM

## 2018-09-07 LAB — WET PREP FOR TRICH, YEAST, CLUE

## 2018-09-07 MED ORDER — FLUCONAZOLE 150 MG PO TABS: 150.0000 mg | ORAL_TABLET | Freq: Once | ORAL | 0 refills | Status: AC

## 2018-09-07 NOTE — Telephone Encounter (Signed)
-----   Message from Huel Cote, NP sent at 09/07/2018  9:37 AM EDT ----- Needs diag bilat mammogram has pain/discomfort outer aspect of both breasts, anytime ok

## 2018-09-07 NOTE — Patient Instructions (Signed)
Candidiasis vaginal en los adultos  Vaginal Yeast infection, Adult    La infeccin mictica vaginal es una afeccin que causa secrecin vaginal y tambin dolor, hinchazn y enrojecimiento (inflamacin) de la vagina. Esta es una enfermedad frecuente. Algunas mujeres contraen esta infeccin con frecuencia.  Cules son las causas?  La causa de la infeccin es un cambio en el equilibrio normal de los hongos (cndida) y las bacterias que viven en la vagina. Esta alteracin deriva en el crecimiento excesivo de los hongos, lo que causa la inflamacin.  Qu incrementa el riesgo?  La afeccin es ms probable en las mujeres que tienen estas caractersticas:   Toman antibiticos.   Tienen diabetes.   Toman anticonceptivos orales.   Estn embarazadas.   Se hacen duchas vaginales con frecuencia.   Tienen debilitado el sistema de defensa del organismo (sistema inmunitario).   Han estado tomando medicamentos con corticoesteroides durante mucho tiempo.   Usan ropa ajustada con frecuencia.  Cules son los signos o los sntomas?  Los sntomas de esta afeccin incluyen los siguientes:   Secrecin vaginal blanca, cremosa y espesa.   Hinchazn, picazn, enrojecimiento e irritacin de la vagina. Los labios de la vagina (vulva) tambin se pueden infectar.   Dolor o ardor al orinar.   Dolor durante las relaciones sexuales.  Cmo se diagnostica?  Esta afeccin se diagnostica en funcin de lo siguiente:   Sus antecedentes mdicos.   Un examen fsico.   Examen plvico. El mdico examinar una muestra de la secrecin vaginal con un microscopio. Probablemente el mdico enve esta muestra al laboratorio para analizarla y confirmar el diagnstico.  Cmo se trata?  Esta afeccin se trata con medicamentos. Los medicamentos pueden ser recetados o de venta libre. Podrn indicarle que use uno o ms de lo siguiente:   Medicamentos que se toman por boca (orales).   Medicamentos que se aplican como una crema  (tpicos).   Medicamentos que se colocan directamente en la vagina (vulos vaginales).  Siga estas indicaciones en su casa:    Estilo de vida   No tenga relaciones sexuales hasta que el mdico lo autorice. Comunique a su compaero sexual que tiene una infeccin por hongos. Esa persona debera visitar a su mdico y preguntarle si debera recibir tratamiento tambin.   No use ropa ajustada, como pantimedias o pantalones ajustados.   Use ropa interior de algodn, que permite el paso del aire.  Indicaciones generales   Tome o aplquese los medicamentos de venta libre y los recetados solamente como se lo haya indicado el mdico.   Consuma ms yogur. Esto puede ayudar a evitar la recurrencia de la candidiasis.   No use tampones hasta que el mdico la autorice.   Intente darse un bao de asiento para aliviar las molestias. Se trata de un bao de agua tibia que se toma mientras se est sentado. El agua solo debe llegar hasta las caderas y cubrir las nalgas. Hgalo 3o 4veces al da o como se lo haya indicado el mdico.   No se haga duchas vaginales.   Si tiene diabetes, mantenga bajo control los niveles de azcar en la sangre.   Concurra a todas las visitas de control como se lo haya indicado el mdico. Esto es importante.  Comunquese con un mdico si:   Tiene fiebre.   Los sntomas desaparecen y luego vuelven a aparecer.   Los sntomas no mejoran con el tratamiento.   Sus sntomas empeoran.   Aparecen nuevos sntomas.   Aparecen ampollas alrededor   o adentro de la vagina.   Le sale sangre de la vagina y no est menstruando.   Siente dolor en el abdomen.  Resumen   La infeccin mictica vaginal es una afeccin que causa secrecin y tambin dolor, hinchazn y enrojecimiento (inflamacin) de la vagina.   Esta afeccin se trata con medicamentos. Los medicamentos pueden ser recetados o de venta libre.   Tome o aplquese los medicamentos de venta libre y los recetados solamente como se lo haya indicado el  mdico.   No se haga duchas vaginales. No tenga relaciones sexuales ni use tampones hasta que el mdico la autorice.   Comunquese con un mdico si los sntomas no mejoran con el tratamiento o si los sntomas desaparecen y luego regresan.  Esta informacin no tiene como fin reemplazar el consejo del mdico. Asegrese de hacerle al mdico cualquier pregunta que tenga.  Document Released: 03/25/2005 Document Revised: 12/23/2017 Document Reviewed: 12/23/2017  Elsevier Interactive Patient Education  2019 Elsevier Inc[employer]

## 2018-09-07 NOTE — Telephone Encounter (Signed)
Patient scheduled at breast center on 09/09/18 @ 10:50am, will route to claudia to relay in spanish

## 2018-09-07 NOTE — Progress Notes (Signed)
53 year old M HF G0 presents with complaint of white vaginal discharge with itching for the past week.  Denies urinary symptoms other than some frequency, denies pain at end of stream, abdominal pain or fever.  Had a benign endometrial polyp removed 11/2017 had a thickened endometrium and was placed on Micronor, stayed on the Micronor for 6 months and had bleeding each month, stopped in December and has had no bleeding since.  History of endometriosis, no other health problems.  Also states has had some breast tenderness outer aspects of both breasts for the past few weeks, denies change in routine of exercise, work.  Denies  palpable breast changes or nipple discharge.  Normal mammogram history.  Exam: Appears well.  Breast examined sitting and lying position tenderness on both breasts outer aspects into the axilla area no palpable nodules, masses, erythema noted, dimpling, nipple discharge.  Abdomen soft, nontender, external genitalia within normal limits, speculum exam scant white discharge mild erythema noted, wet prep positive for yeast.  Bimanual no CMT or tenderness.  Yeast vaginitis  Bilateral outer aspect breast tenderness  Plan: Diflucan 150 p.o. x1 dose.  Yeast prevention discussed.  Instructed to call if no relief.  Diagnostic bilateral mammograms, reviewed most likely not a problem especially since tenderness is both sides, reassurance given.  Keep scheduled annual exam appointment with Dr. Dellis Filbert, possible ultrasound, reviewed 6 months of the Micronor may be enough to thin lining of endometrium.

## 2018-09-07 NOTE — Telephone Encounter (Signed)
Left detailed message for patient about her appointment at the breast center.

## 2018-09-07 NOTE — Addendum Note (Signed)
Addended by: Franchot Gallo on: 09/07/2018 11:45 AM   Modules accepted: Orders

## 2018-09-09 ENCOUNTER — Other Ambulatory Visit: Payer: Self-pay

## 2018-09-09 ENCOUNTER — Ambulatory Visit
Admission: RE | Admit: 2018-09-09 | Discharge: 2018-09-09 | Disposition: A | Discharge status: Home / Self Care | Payer: 59 | Source: Ambulatory Visit | Attending: Women's Health | Admitting: Women's Health

## 2018-09-09 ENCOUNTER — Ambulatory Visit: Payer: 59

## 2018-09-09 ENCOUNTER — Telehealth: Payer: Self-pay | Admitting: *Deleted

## 2018-09-09 DIAGNOSIS — N644 Mastodynia: Secondary | ICD-10-CM

## 2018-09-09 DIAGNOSIS — R922 Inconclusive mammogram: Secondary | ICD-10-CM | POA: Diagnosis not present

## 2018-09-09 LAB — URINE CULTURE
MICRO NUMBER:: 311067
SPECIMEN QUALITY:: ADEQUATE

## 2018-09-09 LAB — URINALYSIS, COMPLETE W/RFL CULTURE
BILIRUBIN URINE: NEGATIVE
Glucose, UA: NEGATIVE
Hyaline Cast: NONE SEEN /LPF
Ketones, ur: NEGATIVE
Leukocyte Esterase: NEGATIVE
Nitrites, Initial: NEGATIVE
Protein, ur: NEGATIVE
Specific Gravity, Urine: 1.02 (ref 1.001–1.03)
pH: 5 (ref 5.0–8.0)

## 2018-09-09 LAB — CULTURE INDICATED

## 2018-09-09 MED ORDER — AMPICILLIN 500 MG PO CAPS: 500.0000 mg | ORAL_CAPSULE | Freq: Three times a day (TID) | ORAL | 0 refills | Status: DC

## 2018-09-09 NOTE — Telephone Encounter (Signed)
Rx sent 

## 2018-09-09 NOTE — Telephone Encounter (Signed)
Spoke with patient she does have UTI symptoms (pain and frequency). Her pharmacy is [facility] on [address].

## 2018-09-09 NOTE — Telephone Encounter (Signed)
-----   Message from Huel Cote, NP sent at 09/09/2018 11:50 AM EDT ----- Please call and review urine showed some bacteria commonly found on skin, if no symptoms of UTI, no med, if UTI symptoms ampicilling 500 TID for 3 days

## 2018-09-09 NOTE — Telephone Encounter (Signed)
Rosemarie Ax will you relay and let me know if Rx should be sent. And which pharmacy she has 2 in chart.

## 2018-09-16 ENCOUNTER — Other Ambulatory Visit: Payer: Self-pay

## 2018-09-16 ENCOUNTER — Encounter: Payer: Self-pay | Admitting: Obstetrics & Gynecology

## 2018-09-16 ENCOUNTER — Ambulatory Visit: Payer: 59 | Admitting: Obstetrics & Gynecology

## 2018-09-16 VITALS — BP 122/78 | Ht 60.0 in | Wt 132.0 lb

## 2018-09-16 DIAGNOSIS — L292 Pruritus vulvae: Secondary | ICD-10-CM | POA: Diagnosis not present

## 2018-09-16 DIAGNOSIS — Z01419 Encounter for gynecological examination (general) (routine) without abnormal findings: Secondary | ICD-10-CM | POA: Diagnosis not present

## 2018-09-16 DIAGNOSIS — N914 Secondary oligomenorrhea: Secondary | ICD-10-CM

## 2018-09-16 MED ORDER — FLUCONAZOLE 150 MG PO TABS: 150.0000 mg | ORAL_TABLET | Freq: Every day | ORAL | 1 refills | Status: AC

## 2018-09-16 MED ORDER — NORETHINDRONE 0.35 MG PO TABS: 1.0000 | ORAL_TABLET | Freq: Every day | ORAL | 4 refills | Status: DC

## 2018-09-16 NOTE — Addendum Note (Signed)
Addended by: Thurnell Garbe A on: 09/16/2018 10:36 AM   Modules accepted: Orders

## 2018-09-16 NOTE — Patient Instructions (Signed)
1. Encounter for routine gynecological examination with Papanicolaou smear of cervix Normal gynecologic exam.  Pap reflex done.  Last Pap test March 2019 was negative with negative high-risk HPV, but transitional zone cells were absent.  Breast exam normal.  Screening mammogram March 2020-.  Health labs with family physician.  Refer to gastroenterology for colonoscopy.  Good body mass index at 25.78, continue with fitness and healthy nutrition. - CBC - Comp Met (CMET) - TSH - Lipid panel - VITAMIN D 25 Hydroxy (Vit-D Deficiency, Fractures)  2. Secondary oligomenorrhea Oligomenorrhea probably associated with perimenopause.  We will do an [facility] today.  Follow-up with pelvic ultrasound to evaluate the endometrium. [facility] - US Transvaginal Non-OB; Future  3. Vulvar itching Vulvar itching post antibiotics for cystitis.  Probable yeast vaginitis.  Will treat with fluconazole 150 mg 1 tablet daily for 3 days.  Usage reviewed and prescription sent to pharmacy.  Other orders - fluconazole (DIFLUCAN) 150 MG tablet; Take 1 tablet (150 mg total) by mouth daily for 3 days. - norethindrone (MICRONOR,CAMILA,ERRIN) 0.35 MG tablet; Take 1 tablet (0.35 mg total) by mouth daily.  [place], fue un placer verle hoy!  Voy a informarle de sus [employer].

## 2018-09-16 NOTE — Progress Notes (Signed)
[place] 12-17-1965 [id number]   History:    53 y.o. G0 Married  RP:  Established patient presenting for annual gyn exam   HPI: Patient had hysteroscopy with excision of a polyp and D&C in May 2019.  Pathology was benign.  At that time she was started on birth control pills to regulate her cycle and for contraception.  She stopped the pills after few months because of breakthrough bleeding.  Last menstrual period May 18, 2018.  Occasional hot flushes.  No pelvic pain.  Treated recently for a bladder infection.  Urinary tract infection symptoms resolved.  Patient complains of vulvar itching.  No pain with intercourse.  Breast normal except for occasional cyclic pain on the right breast.  Screening mammogram -March 2020.  Body mass index 25.78.  Exercising regularly.  Health labs with family physician.  No colonoscopy done yet.  Past medical history,surgical history, family history and social history were all reviewed and documented in the EPIC chart.  Gynecologic History Patient's last menstrual period was 05/18/2018. Contraception: none Last Pap: 08/2017. Results were: Negative/HPV HR neg, but TZ cells absent Last mammogram: 08/2018.  Results were: Negative Bone Density: Never Colonoscopy: Never.  Referred to Gastro  Obstetric History OB History  Gravida Para Term Preterm AB Living  0 0 0 0 0 0  SAB TAB Ectopic Multiple Live Births  0 0 0 0       ROS: A ROS was performed and pertinent positives and negatives are included in the history.  GENERAL: No fevers or chills. HEENT: No change in vision, no earache, sore throat or sinus congestion. NECK: No pain or stiffness. CARDIOVASCULAR: No chest pain or pressure. No palpitations. PULMONARY: No shortness of breath, cough or wheeze. GASTROINTESTINAL: No abdominal pain, nausea, vomiting or diarrhea, melena or bright red blood per rectum. GENITOURINARY: No urinary frequency, urgency, hesitancy or dysuria. MUSCULOSKELETAL:  No joint or muscle pain, no back pain, no recent trauma. DERMATOLOGIC: No rash, no itching, no lesions. ENDOCRINE: No polyuria, polydipsia, no heat or cold intolerance. No recent change in weight. HEMATOLOGICAL: No anemia or easy bruising or bleeding. NEUROLOGIC: No headache, seizures, numbness, tingling or weakness. PSYCHIATRIC: No depression, no loss of interest in normal activity or change in sleep pattern.     Exam:   BP 122/78   Ht 5' (1.524 m)   Wt 132 lb (59.9 kg)   LMP 05/18/2018   BMI 25.78 kg/m   Body mass index is 25.78 kg/m.  General appearance : Well developed well nourished female. No acute distress HEENT: Eyes: no retinal hemorrhage or exudates,  Neck supple, trachea midline, no carotid bruits, no thyroidmegaly Lungs: Clear to auscultation, no rhonchi or wheezes, or rib retractions  Heart: Regular rate and rhythm, no murmurs or gallops Breast:Examined in sitting and supine position were symmetrical in appearance, no palpable masses or tenderness,  no skin retraction, no nipple inversion, no nipple discharge, no skin discoloration, no axillary or supraclavicular lymphadenopathy Abdomen: no palpable masses or tenderness, no rebound or guarding Extremities: no edema or skin discoloration or tenderness  Pelvic: Vulva: Normal             Vagina: No gross lesions or discharge  Cervix: No gross lesions or discharge.  Pap reflex done.  Uterus  AV, normal size, shape and consistency, non-tender and mobile  Adnexa  Without masses or tenderness  Anus: Normal   Assessment/Plan:  53 y.o. female for annual exam   1. Encounter for  routine gynecological examination with Papanicolaou smear of cervix Normal gynecologic exam.  Pap reflex done.  Last Pap test March 2019 was negative with negative high-risk HPV, but transitional zone cells were absent.  Breast exam normal.  Screening mammogram March 2020-.  Health labs with family physician.  Refer to gastroenterology for colonoscopy.   Good body mass index at 25.78, continue with fitness and healthy nutrition. - CBC - Comp Met (CMET) - TSH - Lipid panel - VITAMIN D 25 Hydroxy (Vit-D Deficiency, Fractures)  2. Secondary oligomenorrhea Oligomenorrhea probably associated with perimenopause.  We will do an [facility] today.  Follow-up with pelvic ultrasound to evaluate the endometrium. [facility] - US Transvaginal Non-OB; Future  3. Vulvar itching Vulvar itching post antibiotics for cystitis.  Probable yeast vaginitis.  Will treat with fluconazole 150 mg 1 tablet daily for 3 days.  Usage reviewed and prescription sent to pharmacy.  Other orders - fluconazole (DIFLUCAN) 150 MG tablet; Take 1 tablet (150 mg total) by mouth daily for 3 days. - norethindrone (MICRONOR,CAMILA,ERRIN) 0.35 MG tablet; Take 1 tablet (0.35 mg total) by mouth daily.  Counseling on above issues and coordination of care more than 50% for 10 minutes.  Princess Bruins MD, 8:26 AM 09/16/2018

## 2018-09-17 LAB — COMPREHENSIVE METABOLIC PANEL
AG Ratio: 1.5 (calc) (ref 1.0–2.5)
ALT: 21 U/L (ref 6–29)
AST: 20 U/L (ref 10–35)
Albumin: 4.1 g/dL (ref 3.6–5.1)
Alkaline phosphatase (APISO): 67 U/L (ref 37–153)
BUN: 21 mg/dL (ref 7–25)
CO2: 25 mmol/L (ref 20–32)
Calcium: 8.9 mg/dL (ref 8.6–10.4)
Chloride: 106 mmol/L (ref 98–110)
Creat: 0.72 mg/dL (ref 0.50–1.05)
GLOBULIN: 2.8 g/dL (ref 1.9–3.7)
Glucose, Bld: 84 mg/dL (ref 65–99)
Potassium: 4.1 mmol/L (ref 3.5–5.3)
Sodium: 140 mmol/L (ref 135–146)
Total Bilirubin: 0.3 mg/dL (ref 0.2–1.2)
Total Protein: 6.9 g/dL (ref 6.1–8.1)

## 2018-09-17 LAB — CBC
HEMATOCRIT: 38.4 % (ref 35.0–45.0)
Hemoglobin: 13 g/dL (ref 11.7–15.5)
MCH: 27.8 pg (ref 27.0–33.0)
MCHC: 33.9 g/dL (ref 32.0–36.0)
MCV: 82.2 fL (ref 80.0–100.0)
MPV: 11.3 fL (ref 7.5–12.5)
Platelets: 256 10*3/uL (ref 140–400)
RBC: 4.67 10*6/uL (ref 3.80–5.10)
RDW: 12.2 % (ref 11.0–15.0)
WBC: 7 10*3/uL (ref 3.8–10.8)

## 2018-09-17 LAB — LIPID PANEL
Cholesterol: 208 mg/dL — ABNORMAL HIGH (ref <200)
HDL: 86 mg/dL (ref >=50)
LDL Cholesterol (Calc): 106 mg/dL (calc) — ABNORMAL HIGH
Non-HDL Cholesterol (Calc): 122 mg/dL (calc) (ref <130)
Total CHOL/HDL Ratio: 2.4 (calc) (ref <5.0)
Triglycerides: 72 mg/dL (ref <150)

## 2018-09-17 LAB — VITAMIN D 25 HYDROXY (VIT D DEFICIENCY, FRACTURES): Vit D, 25-Hydroxy: 23 ng/mL — ABNORMAL LOW (ref 30–100)

## 2018-09-17 LAB — TSH: TSH: 1.5 mIU/L

## 2018-09-17 LAB — FOLLICLE STIMULATING HORMONE: FSH: 48.1 m[IU]/mL

## 2018-09-19 ENCOUNTER — Other Ambulatory Visit: Payer: Self-pay | Admitting: Anesthesiology

## 2018-09-19 LAB — PAP IG W/ RFLX HPV ASCU

## 2018-09-19 MED ORDER — VITAMIN D (ERGOCALCIFEROL) 1.25 MG (50000 UNIT) PO CAPS: 50000.0000 [IU] | ORAL_CAPSULE | ORAL | 0 refills | Status: DC

## 2018-10-03 ENCOUNTER — Other Ambulatory Visit: Payer: Self-pay

## 2018-10-05 ENCOUNTER — Ambulatory Visit: Payer: 59 | Admitting: Obstetrics & Gynecology

## 2018-10-05 ENCOUNTER — Ambulatory Visit (INDEPENDENT_AMBULATORY_CARE_PROVIDER_SITE_OTHER): Payer: 59

## 2018-10-05 ENCOUNTER — Ambulatory Visit (INDEPENDENT_AMBULATORY_CARE_PROVIDER_SITE_OTHER): Payer: 59 | Admitting: Obstetrics & Gynecology

## 2018-10-05 ENCOUNTER — Encounter: Payer: Self-pay | Admitting: Obstetrics & Gynecology

## 2018-10-05 ENCOUNTER — Other Ambulatory Visit: Payer: Self-pay

## 2018-10-05 ENCOUNTER — Other Ambulatory Visit: Payer: 59

## 2018-10-05 DIAGNOSIS — N914 Secondary oligomenorrhea: Secondary | ICD-10-CM

## 2018-10-05 DIAGNOSIS — N951 Menopausal and female climacteric states: Secondary | ICD-10-CM | POA: Diagnosis not present

## 2018-10-05 MED ORDER — MEDROXYPROGESTERONE ACETATE 5 MG PO TABS: 5.0000 mg | ORAL_TABLET | Freq: Every day | ORAL | 4 refills | Status: DC

## 2018-10-05 NOTE — Progress Notes (Signed)
    [place] 1965-07-11 [id number]        53 y.o.  G0   RP: Oligomenorrhea for Pelvic US  HPI: Menstrual periods every 3 to 5 months.  Currently on the progestin only pill.  No pelvic pain.   OB History  Gravida Para Term Preterm AB Living  0 0 0 0 0 0  SAB TAB Ectopic Multiple Live Births  0 0 0 0      Past medical history,surgical history, problem list, medications, allergies, family history and social history were all reviewed and documented in the EPIC chart.   Directed ROS with pertinent positives and negatives documented in the history of present illness/assessment and plan.  Exam:  There were no vitals filed for this visit. General appearance:  Normal  Pelvic US today: T/V images.  Anteverted uterus measuring 7.98 x 4.84 x 3.89 cm.  Intramural fibroid on the left measuring 1.7 x 1.5 x 1.4 cm with calcification.  Endometrial lining normal at 6.9 mm.  Negative color flow Doppler at the endometrium.(Menses today).  Right and left ovaries normal.  No free fluid in the posterior cul-de-sac.   Assessment/Plan:  53 y.o. G0   1. Secondary oligomenorrhea Pelvic ultrasound findings reviewed with patient.  Patient reassured about her normal endometrial lining at 6.9 mm.  Secondary oligomenorrhea associated with perimenopause.  Patient would prefer to have cyclic Provera rather than the progestin only birth control pills because of frequent breakthrough bleeding.  We will stop the progestin only birth control pills now.  Provera 5 mg/tab. 1 tablet daily for 10 days every 3 months prescribed.  Usage reviewed with patient.  2. Perimenopause Last [facility] September 16, 2018 was at 48.1, patient did not have a menstrual period for 4 months at that time.  Other orders - medroxyPROGESTERone (PROVERA) 5 MG tablet; Take 1 tablet (5 mg total) by mouth daily for 10 days. Cyclic Progesterone every 3 months.  Counseling on above issues and coordination of care more than 50% for 15  minutes.  Princess Bruins MD, 1:55 PM 10/05/2018

## 2018-10-05 NOTE — Patient Instructions (Signed)
1. Secondary oligomenorrhea Pelvic ultrasound findings reviewed with patient.  Patient reassured about her normal endometrial lining at 6.9 mm.  Secondary oligomenorrhea associated with perimenopause.  Patient would prefer to have cyclic Provera rather than the progestin only birth control pills because of frequent breakthrough bleeding.  We will stop the progestin only birth control pills now.  Provera 5 mg/tab. 1 tablet daily for 10 days every 3 months prescribed.  Usage reviewed with patient.  2. Perimenopause Last [facility] September 16, 2018 was at 48.1, patient did not have a menstrual period for 4 months at that time.  Other orders - medroxyPROGESTERone (PROVERA) 5 MG tablet; Take 1 tablet (5 mg total) by mouth daily for 10 days. Cyclic Progesterone every 3 months.  Chelsea Barrett, it was a pleasure seeing you today!

## 2018-10-27 ENCOUNTER — Telehealth: Payer: Self-pay

## 2018-10-27 MED ORDER — TERCONAZOLE 0.8 % VA CREA: 1.0000 | TOPICAL_CREAM | Freq: Every day | VAGINAL | 0 refills | Status: DC

## 2018-10-27 NOTE — Telephone Encounter (Addendum)
Received message from Chauncey Reading. "[10/27/2018 3:06 PM]  Linus Salmons:   inna tisdell mrn [phone] is on phone she had a billing ? that i was able to help w/ she is spanish speaking does speak some english.   she is asking for a RX for cream for a vaginal itch she has from riding her bike she does not want pill  she has had in past."  I called and spoke with patient. Her only complaint is vaginal itching. She bike rides and attibutes to that.  She asked to be prescribed a cream this time as has had Diflucan twice since March.  I sent Rx for Terazol 3 Vag Cream at her request.  Also, patient was prescribed Provera to bring on menses. She was going to start it today but feels like she might start and sees a little spot.  I advised her that she can wait and watch over the weekend and call me Monday and let me know what her period did and I can check with Dr. Marguerita Merles and see what she advised in regards to taking the Provera or not.

## 2018-10-27 NOTE — Telephone Encounter (Signed)
Opened in error. Already had encounter open. 

## 2018-11-02 ENCOUNTER — Encounter: Payer: 59 | Admitting: Obstetrics & Gynecology

## 2018-12-01 ENCOUNTER — Telehealth: Payer: Self-pay | Admitting: *Deleted

## 2018-12-01 ENCOUNTER — Encounter: Payer: Self-pay | Admitting: Family Medicine

## 2018-12-01 ENCOUNTER — Other Ambulatory Visit: Payer: Self-pay

## 2018-12-01 ENCOUNTER — Ambulatory Visit (INDEPENDENT_AMBULATORY_CARE_PROVIDER_SITE_OTHER): Payer: 59 | Admitting: Family Medicine

## 2018-12-01 ENCOUNTER — Telehealth: Payer: Self-pay | Admitting: Medical

## 2018-12-01 DIAGNOSIS — Z20822 Contact with and (suspected) exposure to covid-19: Secondary | ICD-10-CM | POA: Insufficient documentation

## 2018-12-01 DIAGNOSIS — Z20828 Contact with and (suspected) exposure to other viral communicable diseases: Secondary | ICD-10-CM | POA: Diagnosis not present

## 2018-12-01 NOTE — Telephone Encounter (Signed)
Dr. Roma Schanz would like to order covid test.  Patient was exposed to positive covid patient and has loss of taste.

## 2018-12-01 NOTE — Telephone Encounter (Signed)
Patient seen today

## 2018-12-01 NOTE — Telephone Encounter (Signed)
Referred for covid19 testing due to contact with a positive tested person. Dr. Carollee Herter at [place].Appointment made for 12/02/18 at the [facility] site for 8:15a.Aware to wear mask and stay in vehicle.

## 2018-12-01 NOTE — Progress Notes (Signed)
Virtual Visit via Video Note  I connected with Aviannah Castoro on 12/01/18 at  3:40 PM EDT by a video enabled telemedicine application and verified that I am speaking with the correct person using two identifiers.  Location: Patient: home  Provider: home    I discussed the limitations of evaluation and management by telemedicine and the availability of in person appointments. The patient expressed understanding and agreed to proceed.  History of Present Illness: Pt is in car and c/o that a coworker was tested for covid 19 and it was +---- pt denies fever but states she has no taste.  No other symptoms    Observations/Objective: No vitals obtained-- pt was in her car  She is in NAD  Assessment and Plan: 1. Close Exposure to Covid-19 Virus Pt instructed to quarantine at home until test results come back Call with any questions or concerns   Follow Up Instructions:    I discussed the assessment and treatment plan with the patient. The patient was provided an opportunity to ask questions and all were answered. The patient agreed with the plan and demonstrated an understanding of the instructions.   The patient was advised to call back or seek an in-person evaluation if the symptoms worsen or if the condition fails to improve as anticipated.  I provided 15 minutes of non-face-to-face time during this encounter.   Ann Held, DO

## 2018-12-01 NOTE — Assessment & Plan Note (Signed)
Pt to be tested for covid She has been instructed to quarantine at home until the test results come back

## 2018-12-01 NOTE — Telephone Encounter (Signed)
Triaged pt's husband for C/O cough, sore throat, runny nose, increased fatigue. Both he and pt exposed to positive covid friend last week. Husband states pt is also experiencing loss of taste, no fever. Calling to question if covid testing needed. See husbands encounter, transferred to practice.

## 2018-12-02 ENCOUNTER — Other Ambulatory Visit: Payer: 59

## 2018-12-02 DIAGNOSIS — Z20822 Contact with and (suspected) exposure to covid-19: Secondary | ICD-10-CM

## 2018-12-03 ENCOUNTER — Encounter: Payer: Self-pay | Admitting: Family Medicine

## 2018-12-03 ENCOUNTER — Encounter (INDEPENDENT_AMBULATORY_CARE_PROVIDER_SITE_OTHER): Payer: Self-pay

## 2018-12-04 ENCOUNTER — Encounter (INDEPENDENT_AMBULATORY_CARE_PROVIDER_SITE_OTHER): Payer: Self-pay

## 2018-12-04 ENCOUNTER — Other Ambulatory Visit: Payer: Self-pay | Admitting: Obstetrics & Gynecology

## 2018-12-05 ENCOUNTER — Encounter (INDEPENDENT_AMBULATORY_CARE_PROVIDER_SITE_OTHER): Payer: Self-pay

## 2018-12-05 ENCOUNTER — Telehealth: Payer: Self-pay

## 2018-12-05 ENCOUNTER — Telehealth: Payer: Self-pay | Admitting: Medical

## 2018-12-05 NOTE — Telephone Encounter (Signed)
FYI

## 2018-12-05 NOTE — Telephone Encounter (Signed)
There is already a note in my chart for her.  I did it on Friday

## 2018-12-05 NOTE — Telephone Encounter (Signed)
Pt states both she and her husband have tested positive for Covid-19. Calling for advise on precautions. Care advise given per protocols, verbalizes understanding. Also advised to call workplace to review any protocols for returning to work they may enforce. Advised to go to ED for SOB, CP, fever > 101.0 occurs. Verbalizes understanding.

## 2018-12-05 NOTE — Telephone Encounter (Signed)
Copied from [place] [id number]. Topic: General - Other >> Dec 05, 2018  2:02 PM Oneta Rack wrote: Patient states she was dx with COVID and would like a Dr. Note excusing her from work, please advise

## 2018-12-05 NOTE — Telephone Encounter (Signed)
Appointment tomorrow

## 2018-12-06 ENCOUNTER — Ambulatory Visit (INDEPENDENT_AMBULATORY_CARE_PROVIDER_SITE_OTHER): Payer: 59 | Admitting: Medical

## 2018-12-06 ENCOUNTER — Encounter (INDEPENDENT_AMBULATORY_CARE_PROVIDER_SITE_OTHER): Payer: Self-pay

## 2018-12-06 ENCOUNTER — Other Ambulatory Visit: Payer: Self-pay

## 2018-12-06 DIAGNOSIS — U071 COVID-19: Secondary | ICD-10-CM

## 2018-12-06 NOTE — Progress Notes (Signed)
Subjective:    Patient ID: Chelsea Barrett, female    DOB: [birth year], 53 y.o.   MRN: [id number]  HPI  Virtual Visit via Video Note  I connected with Chelsea Barrett on 12/06/18 at  9:20 AM EDT by a video enabled telemedicine application and verified that I am speaking with the correct person using two identifiers.  Location: Patient: home Provider: home  Pt does not have blood pressure cuff.    I discussed the limitations of evaluation and management by telemedicine and the availability of in person appointments. The patient expressed understanding and agreed to proceed.  History of Present Illness:  Pt has recently positive. Pt had close contact driving in car on May 28,2020. Friend helped her move and organize house. She spent whole day with friend. Pt states not much cough. Mild nasal congestion She report some pain in legs. She state both calf hurt when lies down. No redness to legs. Calfs are symmetric. No sob or wheezing.      Observations/Objective: General-no acute distress, pleasant, oriented. Lungs- on inspection lungs appear unlabored. Neck- no tracheal deviation or jvd on inspection. Neuro- gross motor function appears intact. Lower ext- calfs symmetric normal color. No popliteal pain when she stands on tip toes.  Assessment and Plan: Patient has COVID-19 positive test result/infection recently.  Overall she describes doing well except for some mild achiness to her calf muscles bilaterally.  Denies any cough, shortness of breath or wheezing.  No fever reported.  Patient calves are symmetric on inspection.  No redness on inspection.  Also she has no pain in popliteal fossa areas on standing on her tiptoes.  I did explain in detail to patient that coagulation issues/DVTs are potential complication of COVID infection.  If she were to get any significant calf pain, redness or popliteal pain on either side alone then advised her to be seen at [facility] long  emergency department for a lower extremity ultrasound.  Patient expressed understanding.  In addition I did explain more common respiratory type signs or symptoms that would indicate need to be evaluated in the emergency department.  Work excuse note sent to patient's MyChart account.  Asked patient to schedule follow-up in 9 days and I will see if she is clinically improving.  Will likely get her scheduled for repeat testing in about 10days.  If clinically improved and repeat test negative then she return to work.  Follow Up Instructions:    I discussed the assessment and treatment plan with the patient. The patient was provided an opportunity to ask questions and all were answered. The patient agreed with the plan and demonstrated an understanding of the instructions.   The patient was advised to call back or seek an in-person evaluation if the symptoms worsen or if the condition fails to improve as anticipated.  25 minutes spent with patient today.  50% of time spent counseling patient on signs and symptoms that would indicate need for emergency department evaluation.  In addition I explained the criteria for returning back to work.   Mackie Pai, PA-C   Review of Systems  Constitutional: Negative for chills and fatigue.  Respiratory: Negative for chest tightness, shortness of breath and wheezing.   Cardiovascular: Negative for chest pain and palpitations.  Gastrointestinal: Negative for abdominal pain.  Musculoskeletal: Negative for back pain and neck stiffness.       Mild achiness to both calfs. See hpi.  Skin: Negative for rash.  Neurological: Negative for  dizziness, weakness, numbness and headaches.  Hematological: Negative for adenopathy. Does not bruise/bleed easily.  Psychiatric/Behavioral: Negative for behavioral problems and confusion.       Objective:   Physical Exam        Assessment & Plan:

## 2018-12-06 NOTE — Patient Instructions (Addendum)
Patient has COVID-19 positive test result/infection recently.  Overall she describes doing well except for some mild achiness to her calf muscles bilaterally.  Denies any cough, shortness of breath or wheezing.  No fever reported.  Patient calves are symmetric on inspection.  No redness on inspection.  Also she has no pain in popliteal fossa areas on standing on her tiptoes.  I did explain in detail to patient that coagulation issues/DVTs are potential complication of COVID infection.  If she were to get any significant calf pain, redness or popliteal pain on either side alone then advised her to be seen at [facility] long emergency department for a lower extremity ultrasound.  Patient expressed understanding.  In addition I did explain more common respiratory type signs or symptoms that would indicate need to be evaluated in the emergency department.  Presently for calf pain/myalgias advised to just use Tylenol.  Work excuse note sent to patient's MyChart account.  Asked patient to schedule follow-up in 9 days and I will see if she is clinically improving.  Will likely get her scheduled for repeat testing in about 10days.  If clinically improved and repeat test negative then she return to work.

## 2018-12-07 ENCOUNTER — Telehealth: Payer: Self-pay

## 2018-12-07 LAB — NOVEL CORONAVIRUS, NAA: SARS-CoV-2, NAA: DETECTED — AB

## 2018-12-07 NOTE — Telephone Encounter (Signed)
Call received from [facility] to report patient COVID-19 results. Per lab not recorded 12/05/2018. Office was notified of results and instructed that patient needed notified.

## 2018-12-09 ENCOUNTER — Encounter (INDEPENDENT_AMBULATORY_CARE_PROVIDER_SITE_OTHER): Payer: Self-pay

## 2018-12-10 ENCOUNTER — Encounter (INDEPENDENT_AMBULATORY_CARE_PROVIDER_SITE_OTHER): Payer: Self-pay

## 2018-12-11 ENCOUNTER — Encounter (INDEPENDENT_AMBULATORY_CARE_PROVIDER_SITE_OTHER): Payer: Self-pay

## 2018-12-13 ENCOUNTER — Encounter (INDEPENDENT_AMBULATORY_CARE_PROVIDER_SITE_OTHER): Payer: Self-pay

## 2018-12-15 NOTE — Progress Notes (Signed)
Subjective:    Patient ID: Chelsea Barrett, female    DOB: 24-Apr-1966, 53 y.o.   MRN: [id number]  HPI  Virtual Visit via Video Note  I connected with [place] on 12/15/18 at  9:00 AM EDT by a video enabled telemedicine application and verified that I am speaking with the correct person using two identifiers.  Location: Patient: home  Provider: office   I discussed the limitations of evaluation and management by telemedicine and the availability of in person appointments. The patient expressed understanding and agreed to proceed.  History of Present Illness: Pt last visit had positive covid test hx. Pt had close contact driving in car on May 28,2020. Friend helped her move and organize house. She spent whole day with friend. Pt states not much cough. Mild nasal congestion She report some pain in legs. She state both calf hurt when lies down. No redness to legs. Calfs are symmetric. No sob or wheezing  On A/P I had recommended.  Due to only minimal calf pain explained. in detail to patient that coagulation issues/DVTs are potential complication of COVID infection.  If she were to get any significant calf pain, redness or popliteal pain on either side alone then advised her to be seen at [facility] long emergency department for a lower extremity ultrasound.  Patient expressed understanding.  In addition I did explain more common respiratory type signs or symptoms that would indicate need to be evaluated in the emergency department.  Since then patient now reporting.   Pt states in general she is feeling ok. She has some subjective fever, some sweating, moderate st and occasional cough one to two time a day. Mild nasal congestion. No obvious wheeze.  Pt covid test was positive on 12-02-2018.    Observations/Objective: General-no acute distress, pleasant, oriented. Lungs- on inspection lungs appear unlabored. Neck- no tracheal deviation or jvd on inspection. Neuro-  gross motor function appears intact. Lower ext- calfs symmetric. Not swollen. No warm.    Past Medical History:  Diagnosis Date  . Anemia   . Chalazion of both eyes   . Endometrial polyp   . Endometriosis   . Lumbar herniated disc    L5  . Pain in both lower legs   . Reactive airway disease    exposure to sand blasting  . Urinary frequency   . Vitamin D deficiency      Social History   Socioeconomic History  . Marital status: Married    Spouse name: Not on file  . Number of children: Not on file  . Years of education: Not on file  . Highest education level: Not on file  Occupational History  . Not on file  Social Needs  . Financial resource strain: Not on file  . Food insecurity    Worry: Not on file    Inability: Not on file  . Transportation needs    Medical: Not on file    Non-medical: Not on file  Tobacco Use  . Smoking status: Never Smoker  . Smokeless tobacco: Never Used  Substance and Sexual Activity  . Alcohol use: Not Currently    Alcohol/week: 0.0 standard drinks    Frequency: Never  . Drug use: Not Currently  . Sexual activity: Yes    Partners: Male    Birth control/protection: None    Comment: 1ST INTERCOURSE- 18, PARTNERS- 3, married- 19 yrs   Lifestyle  . Physical activity    Days per week: Not  on file    Minutes per session: Not on file  . Stress: Not on file  Relationships  . Social Herbalist on phone: Not on file    Gets together: Not on file    Attends religious service: Not on file    Active member of club or organization: Not on file    Attends meetings of clubs or organizations: Not on file    Relationship status: Not on file  . Intimate partner violence    Fear of current or ex partner: Not on file    Emotionally abused: Not on file    Physically abused: Not on file    Forced sexual activity: Not on file  Other Topics Concern  . Not on file  Social History Narrative  . Not on file    Past Surgical History:   Procedure Laterality Date  . BREAST BIOPSY  2008   No malignancy  . DILATATION & CURETTAGE/HYSTEROSCOPY WITH MYOSURE N/A 11/10/2017   Procedure: DILATATION & CURETTAGE/HYSTEROSCOPY WITH MYOSURE;  Surgeon: Princess Bruins, MD;  Location: [facility];  Service: Gynecology;  Laterality: N/A;  request to follow around 10:15am in [facility] time requests one hour  . DILATION AND CURETTAGE OF UTERUS  2005  . PELVIC LAPAROSCOPY      Family History  Problem Relation Age of Onset  . Prostate cancer Father 26       Deceased  . Lymphoma Mother 68       Neck-Deceased  . COPD Maternal Grandmother   . Healthy Brother        x5  . Healthy Sister        x8  . Breast cancer Neg Hx     No Known Allergies  Current Outpatient Medications on File Prior to Visit  Medication Sig Dispense Refill  . terconazole (TERAZOL 3) 0.8 % vaginal cream Place 1 applicator vaginally at bedtime. 20 g 0  . Vitamin D, Ergocalciferol, (DRISDOL) 1.25 MG (50000 UT) CAPS capsule Take 1 capsule (50,000 Units total) by mouth every 7 (seven) days. 12 capsule 0  . [DISCONTINUED] Norethindrone Acetate-Ethinyl Estradiol (JUNEL,LOESTRIN,MICROGESTIN) 1.5-30 MG-MCG tablet Take 1 tablet by mouth daily. 1 Package 11   No current facility-administered medications on file prior to visit.     There were no vitals taken for this visit.  Assessment and Plan: Patient had positive COVID test about 2 weeks ago.  Initially only mild calf soreness but that symptom has resolved completely.  Now she reports symptoms of mild daily sore throat, nasal congestion, subjective fever and occasional rare cough is not productive.  No wheezing or shortness of breath.  With current symptoms, I decided to prescribe her a azithromycin to cover for possible throat infection as well as possible treat early bronchitis versus pneumonia.  For cough, I prescribed benzonatate.  Patient overall appears stable presently so I do not  think she needs emergency department evaluation.  If she does get worsening or changing signs symptoms then explained for her to be evaluated at [facility] long emergency department.  Patient expressed understanding.  Hopefully patient will improve over the next day and possibly be symptom-free by middle of next week.  If so then would likely get COVID testing at that point in time.  If patient still symptomatic then would probably get COVID repeat testing a week from this coming Monday.  Patient is going to update me by my chart Wednesday early morning.  Follow-up date to  be determined depending on how she is doing on Wednesday.  Patient is aware to stay at home and self isolate until next appointment/communication.  Follow Up Instructions:    I discussed the assessment and treatment plan with the patient. The patient was provided an opportunity to ask questions and all were answered. The patient agreed with the plan and demonstrated an understanding of the instructions.   The patient was advised to call back or seek an in-person evaluation if the symptoms worsen or if the condition fails to improve as anticipated.  I provided 25 minutes of non-face-to-face time during this encounter.  Percent of time spent counseling patient on plan going forward.   Mackie Pai, PA-C    Review of Systems  Constitutional: Negative for chills, fatigue and fever.  HENT: Positive for congestion and sore throat. Negative for drooling and ear pain.   Respiratory: Negative for cough, chest tightness, shortness of breath and wheezing.   Cardiovascular: Negative for chest pain and palpitations.  Gastrointestinal: Negative for abdominal pain, nausea and vomiting.  Musculoskeletal: Negative for back pain.  Neurological: Negative for dizziness, speech difficulty, weakness, numbness and headaches.  Hematological: Negative for adenopathy. Does not bruise/bleed easily.  Psychiatric/Behavioral: Negative for behavioral  problems and confusion. The patient is not nervous/anxious.    Past Medical History:  Diagnosis Date  . Anemia   . Chalazion of both eyes   . Endometrial polyp   . Endometriosis   . Lumbar herniated disc    L5  . Pain in both lower legs   . Reactive airway disease    exposure to sand blasting  . Urinary frequency   . Vitamin D deficiency      Social History   Socioeconomic History  . Marital status: Married    Spouse name: Not on file  . Number of children: Not on file  . Years of education: Not on file  . Highest education level: Not on file  Occupational History  . Not on file  Social Needs  . Financial resource strain: Not on file  . Food insecurity    Worry: Not on file    Inability: Not on file  . Transportation needs    Medical: Not on file    Non-medical: Not on file  Tobacco Use  . Smoking status: Never Smoker  . Smokeless tobacco: Never Used  Substance and Sexual Activity  . Alcohol use: Not Currently    Alcohol/week: 0.0 standard drinks    Frequency: Never  . Drug use: Not Currently  . Sexual activity: Yes    Partners: Male    Birth control/protection: None    Comment: 1ST INTERCOURSE- 52, PARTNERS- 3, married- 17 yrs   Lifestyle  . Physical activity    Days per week: Not on file    Minutes per session: Not on file  . Stress: Not on file  Relationships  . Social Herbalist on phone: Not on file    Gets together: Not on file    Attends religious service: Not on file    Active member of club or organization: Not on file    Attends meetings of clubs or organizations: Not on file    Relationship status: Not on file  . Intimate partner violence    Fear of current or ex partner: Not on file    Emotionally abused: Not on file    Physically abused: Not on file    Forced sexual activity: Not on file  Other Topics Concern  . Not on file  Social History Narrative  . Not on file    Past Surgical History:  Procedure Laterality Date  .  BREAST BIOPSY  2008   No malignancy  . DILATATION & CURETTAGE/HYSTEROSCOPY WITH MYOSURE N/A 11/10/2017   Procedure: DILATATION & CURETTAGE/HYSTEROSCOPY WITH MYOSURE;  Surgeon: Princess Bruins, MD;  Location: [facility];  Service: Gynecology;  Laterality: N/A;  request to follow around 10:15am in [facility] time requests one hour  . DILATION AND CURETTAGE OF UTERUS  2005  . PELVIC LAPAROSCOPY      Family History  Problem Relation Age of Onset  . Prostate cancer Father 87       Deceased  . Lymphoma Mother 6       Neck-Deceased  . COPD Maternal Grandmother   . Healthy Brother        x5  . Healthy Sister        x8  . Breast cancer Neg Hx     No Known Allergies  Current Outpatient Medications on File Prior to Visit  Medication Sig Dispense Refill  . terconazole (TERAZOL 3) 0.8 % vaginal cream Place 1 applicator vaginally at bedtime. 20 g 0  . Vitamin D, Ergocalciferol, (DRISDOL) 1.25 MG (50000 UT) CAPS capsule Take 1 capsule (50,000 Units total) by mouth every 7 (seven) days. 12 capsule 0  . [DISCONTINUED] Norethindrone Acetate-Ethinyl Estradiol (JUNEL,LOESTRIN,MICROGESTIN) 1.5-30 MG-MCG tablet Take 1 tablet by mouth daily. 1 Package 11   No current facility-administered medications on file prior to visit.     There were no vitals taken for this visit.      Objective:   Physical Exam        Assessment & Plan:

## 2018-12-16 ENCOUNTER — Ambulatory Visit (INDEPENDENT_AMBULATORY_CARE_PROVIDER_SITE_OTHER): Payer: 59 | Admitting: Medical

## 2018-12-16 ENCOUNTER — Other Ambulatory Visit: Payer: Self-pay

## 2018-12-16 DIAGNOSIS — M79661 Pain in right lower leg: Secondary | ICD-10-CM | POA: Diagnosis not present

## 2018-12-16 DIAGNOSIS — J029 Acute pharyngitis, unspecified: Secondary | ICD-10-CM | POA: Diagnosis not present

## 2018-12-16 DIAGNOSIS — U071 COVID-19: Secondary | ICD-10-CM

## 2018-12-16 DIAGNOSIS — M79662 Pain in left lower leg: Secondary | ICD-10-CM | POA: Diagnosis not present

## 2018-12-16 MED ORDER — BENZONATATE 100 MG PO CAPS: 100.0000 mg | ORAL_CAPSULE | Freq: Three times a day (TID) | ORAL | 0 refills | Status: DC | PRN

## 2018-12-16 MED ORDER — AZITHROMYCIN 250 MG PO TABS: ORAL_TABLET | ORAL | 0 refills | Status: DC

## 2018-12-16 NOTE — Patient Instructions (Addendum)
Patient had positive COVID test about 2 weeks ago.  Initially only mild calf soreness but that symptom has resolved completely.  Now she reports symptoms of mild daily sore throat, nasal congestion, subjective fever and occasional rare cough is not productive.  No wheezing or shortness of breath.  With current symptoms, I decided to prescribe her a azithromycin to cover for possible throat infection as well as possible treat early bronchitis versus pneumonia.  For cough, I prescribed benzonatate.  Patient overall appears stable presently so I do not think she needs emergency department evaluation.  If she does get worsening or changing signs symptoms then explained for her to be evaluated at [facility] long emergency department.  Patient expressed understanding.  Hopefully patient will improve over the next day and possibly be symptom-free by middle of next week.  If so then would likely get COVID testing at that point in time.  If patient still symptomatic then would probably get COVID repeat testing a week from this coming Monday.  Patient is going to update me by my chart Wednesday early morning.  Follow-up date to be determined depending on how she is doing on Wednesday.  Patient is aware to stay at home and self isolate until next appointment/communication.

## 2018-12-21 ENCOUNTER — Encounter: Payer: Self-pay | Admitting: Medical

## 2018-12-22 ENCOUNTER — Telehealth: Payer: Self-pay | Admitting: Medical

## 2018-12-22 DIAGNOSIS — Z20822 Contact with and (suspected) exposure to covid-19: Secondary | ICD-10-CM

## 2018-12-22 NOTE — Telephone Encounter (Signed)
Pt has had covid and she sent me my chart message reporting all of her symptoms resolved. Now want her to get repeat covid test to verify negative test as she returns to work.  Could you call pt to get her tested for repeat nasal swab. If you could get her tested tomorrow am so she will be ready to return early next week.  Let me know results so will clear her for work.

## 2018-12-22 NOTE — Telephone Encounter (Signed)
I am quite sure did not guarantee no need for repeat visit. She does not want visit and wants to save cost so if she you could ask about  fever last 3 days with no meds,  and symptoms all resolved/resolving. If you would let me know and document her answer then I could send note for [facility] to arrange testing.   What I am bit hesitant to do is repeat study when still obvious symptomatic as false negative covid test  is possible.

## 2018-12-22 NOTE — Telephone Encounter (Signed)
Scheduled patient for COVID 19 test tomorrow at 8:15 am at [facility].  Testing protocol reviewed.

## 2018-12-22 NOTE — Telephone Encounter (Signed)
I called to schedule VV with provider. Pt was notified Percell Miller is out of office until 12/26/2018. Pt declining VV with another provider stating she was told by Percell Miller she would not need another visit. Pt husband states they don't want to pay for another visit. Please advise on Monday when Percell Miller returns if he will order.

## 2018-12-22 NOTE — Telephone Encounter (Signed)
I just sent pt my chart message as she sent me email with the info I needed. So I went ahead and notified her on repeat covid test. Sent message to pec. So you don't need to call her.

## 2018-12-22 NOTE — Addendum Note (Signed)
Addended by: Denyce Robert on: 12/22/2018 04:21 PM   Modules accepted: Orders

## 2018-12-22 NOTE — Telephone Encounter (Signed)
Needs virtual visit to discuss.

## 2018-12-22 NOTE — Telephone Encounter (Signed)
Pt want to have another covid test to show negative result because of her job. Please advise pt.

## 2018-12-23 ENCOUNTER — Ambulatory Visit: Payer: 59 | Admitting: Family Medicine

## 2018-12-23 ENCOUNTER — Other Ambulatory Visit: Payer: 59

## 2018-12-23 DIAGNOSIS — Z20822 Contact with and (suspected) exposure to covid-19: Secondary | ICD-10-CM

## 2018-12-26 NOTE — Telephone Encounter (Signed)
Saguier, Percell Miller, PA-C 4 days ago     I just sent pt my chart message as she sent me email with the info I needed. So I went ahead and notified her on repeat covid test. Sent message to pe[facility]. So you don't need to call her.

## 2018-12-29 LAB — NOVEL CORONAVIRUS, NAA: SARS-CoV-2, NAA: NOT DETECTED

## 2019-01-24 ENCOUNTER — Ambulatory Visit: Payer: Self-pay | Admitting: Medical

## 2019-01-24 ENCOUNTER — Other Ambulatory Visit: Payer: Self-pay

## 2019-01-24 ENCOUNTER — Encounter: Payer: Self-pay | Admitting: Medical

## 2019-01-24 ENCOUNTER — Ambulatory Visit (INDEPENDENT_AMBULATORY_CARE_PROVIDER_SITE_OTHER): Payer: 59 | Admitting: Medical

## 2019-01-24 VITALS — BP 144/107 | HR 90 | Temp 96.0°F

## 2019-01-24 DIAGNOSIS — R5383 Other fatigue: Secondary | ICD-10-CM | POA: Diagnosis not present

## 2019-01-24 DIAGNOSIS — R109 Unspecified abdominal pain: Secondary | ICD-10-CM | POA: Diagnosis not present

## 2019-01-24 DIAGNOSIS — E785 Hyperlipidemia, unspecified: Secondary | ICD-10-CM

## 2019-01-24 MED ORDER — FAMOTIDINE 20 MG PO TABS: 20.0000 mg | ORAL_TABLET | Freq: Every day | ORAL | 0 refills | Status: DC

## 2019-01-24 MED ORDER — ONDANSETRON 8 MG PO TBDP: 8.0000 mg | ORAL_TABLET | Freq: Three times a day (TID) | ORAL | 0 refills | Status: DC | PRN

## 2019-01-24 NOTE — Telephone Encounter (Signed)
Pt.'s husband reports pt. Started having lower abdominal pain Sunday and it has become worse. No fever, no vomiting or diarrhea.Pain does not radiate. 6/10 pain scale. Warm transfer to [facility] in the practice.  Answer Assessment - Initial Assessment Questions 1. LOCATION: "Where does it hurt?"      Hurts lower abd. All across 2. RADIATION: "Does the pain shoot anywhere else?" (e.g., chest, back)     No 3. ONSET: "When did the pain begin?" (e.g., minutes, hours or days ago)      Started Sunday 4. SUDDEN: "Gradual or sudden onset?"     Gradual 5. PATTERN "Does the pain come and go, or is it constant?"    - If constant: "Is it getting better, staying the same, or worsening?"      (Note: Constant means the pain never goes away completely; most serious pain is constant and it progresses)     - If intermittent: "How long does it last?" "Do you have pain now?"     (Note: Intermittent means the pain goes away completely between bouts)     Comes and goes 6. SEVERITY: "How bad is the pain?"  (e.g., Scale 1-10; mild, moderate, or severe)   - MILD (1-3): doesn't interfere with normal activities, abdomen soft and not tender to touch    - MODERATE (4-7): interferes with normal activities or awakens from sleep, tender to touch    - SEVERE (8-10): excruciating pain, doubled over, unable to do any normal activities      6 7. RECURRENT SYMPTOM: "Have you ever had this type of abdominal pain before?" If so, ask: "When was the last time?" and "What happened that time?"      No 8. CAUSE: "What do you think is causing the abdominal pain?"     Unsure 9. RELIEVING/AGGRAVATING FACTORS: "What makes it better or worse?" (e.g., movement, antacids, bowel movement)     No 10. OTHER SYMPTOMS: "Has there been any vomiting, diarrhea, constipation, or urine problems?"       No 11. PREGNANCY: "Is there any chance you are pregnant?" "When was your last menstrual period?"       No  Protocols used: ABDOMINAL PAIN -  [facility]

## 2019-01-24 NOTE — Progress Notes (Signed)
Subjective:    Patient ID: Chelsea Barrett, female    DOB: 06-06-1966, 53 y.o.   MRN: [id number]  HPI  Virtual Visit via Video Note  I connected with Chelsea Barrett on 01/24/19 at  1:20 PM EDT by a video enabled telemedicine application and verified that I am speaking with the correct person using two identifiers.  Location: Patient: home Provider: office   I discussed the limitations of evaluation and management by telemedicine and the availability of in person appointments. The patient expressed understanding and agreed to proceed.  Pt checked her vit D in past.   History of Present Illness: Pt states recent abdomen pain past 2-3 days. Pain in abdomen, fatigue, and ha.  Pt has pain around belly button. No pain on urinating. No pain over kidney pain. No vomiting. Mild nausea last night. Pt states pain  Sunday afternoon after eating. She at some canned peaches from store. But no pain later after eating.  No fever, no myalgias, no chills, no uti symtpoms and no diarrhea.  Pt has checked temp. Last night 96.   Hx of low vit d in the past and felt weak.  Pain not directly after eating.  Pt had covid in June. She was retested and was negative. She got better clinically and above recent symptoms over past    Observations/Objective: General-no acute distress, pleasant, oriented. Lungs- on inspection lungs appear unlabored. Neck- no tracheal deviation or jvd on inspection. Neuro- gross motor function appears intact. Abdomen- mild epigastric area pain when self palpates.   Assessment and Plan: For recent symptoms of abdomen pain, and fatigue placed future labs to get done tomorrow. These area cbc, cmp, lipase, h pylori breat test, ua, urine culture, vitamin D and tsh.  Rx famotidine sent to pt pharmacy as well as zofran for nausea and vomiting. Recommend hydrate well and bland diet.  Depending on lab results and symptoms response may consider imaging studies.   Follow up in 7 days or as needed  25 minutes spent with pt. 50% of time spent with pt explaining work up and differential dx.  Mackie Pai, PA-C  Follow Up Instructions:    I discussed the assessment and treatment plan with the patient. The patient was provided an opportunity to ask questions and all were answered. The patient agreed with the plan and demonstrated an understanding of the instructions.   The patient was advised to call back or seek an in-person evaluation if the symptoms worsen or if the condition fails to improve as anticipated.  I provided 25 minutes of non-face-to-face time during this encounter.   Mackie Pai, PA-C   Review of Systems  Constitutional: Negative for chills, fatigue and fever.  Respiratory: Negative for cough, chest tightness, shortness of breath and wheezing.   Cardiovascular: Negative for chest pain and palpitations.  Gastrointestinal: Positive for abdominal pain and nausea. Negative for abdominal distention, constipation, diarrhea and vomiting.  Genitourinary: Negative for difficulty urinating, dysuria, flank pain and urgency.  Musculoskeletal: Negative for back pain.  Skin: Negative for rash.  Neurological: Negative for dizziness, weakness, numbness and headaches.  Hematological: Negative for adenopathy.  Psychiatric/Behavioral: Negative for behavioral problems and confusion.   Past Medical History:  Diagnosis Date  . Anemia   . Chalazion of both eyes   . Endometrial polyp   . Endometriosis   . Lumbar herniated disc    L5  . Pain in both lower legs   . Reactive airway disease  exposure to sand blasting  . Urinary frequency   . Vitamin D deficiency      Social History   Socioeconomic History  . Marital status: Married    Spouse name: Not on file  . Number of children: Not on file  . Years of education: Not on file  . Highest education level: Not on file  Occupational History  . Not on file  Social Needs  .  Financial resource strain: Not on file  . Food insecurity    Worry: Not on file    Inability: Not on file  . Transportation needs    Medical: Not on file    Non-medical: Not on file  Tobacco Use  . Smoking status: Never Smoker  . Smokeless tobacco: Never Used  Substance and Sexual Activity  . Alcohol use: Not Currently    Alcohol/week: 0.0 standard drinks    Frequency: Never  . Drug use: Not Currently  . Sexual activity: Yes    Partners: Male    Birth control/protection: None    Comment: 1ST INTERCOURSE- 48, PARTNERS- 3, married- 17 yrs   Lifestyle  . Physical activity    Days per week: Not on file    Minutes per session: Not on file  . Stress: Not on file  Relationships  . Social Herbalist on phone: Not on file    Gets together: Not on file    Attends religious service: Not on file    Active member of club or organization: Not on file    Attends meetings of clubs or organizations: Not on file    Relationship status: Not on file  . Intimate partner violence    Fear of current or ex partner: Not on file    Emotionally abused: Not on file    Physically abused: Not on file    Forced sexual activity: Not on file  Other Topics Concern  . Not on file  Social History Narrative  . Not on file    Past Surgical History:  Procedure Laterality Date  . BREAST BIOPSY  2008   No malignancy  . DILATATION & CURETTAGE/HYSTEROSCOPY WITH MYOSURE N/A 11/10/2017   Procedure: DILATATION & CURETTAGE/HYSTEROSCOPY WITH MYOSURE;  Surgeon: Princess Bruins, MD;  Location: [facility];  Service: Gynecology;  Laterality: N/A;  request to follow around 10:15am in [facility] time requests one hour  . DILATION AND CURETTAGE OF UTERUS  2005  . PELVIC LAPAROSCOPY      Family History  Problem Relation Age of Onset  . Prostate cancer Father 67       Deceased  . Lymphoma Mother 38       Neck-Deceased  . COPD Maternal Grandmother   . Healthy Brother         x5  . Healthy Sister        x8  . Breast cancer Neg Hx     No Known Allergies  Current Outpatient Medications on File Prior to Visit  Medication Sig Dispense Refill  . azithromycin (ZITHROMAX) 250 MG tablet Take 2 tablets by mouth on day 1, followed by 1 tablet by mouth daily for 4 days. 6 tablet 0  . benzonatate (TESSALON) 100 MG capsule Take 1 capsule (100 mg total) by mouth 3 (three) times daily as needed for cough. 30 capsule 0  . terconazole (TERAZOL 3) 0.8 % vaginal cream Place 1 applicator vaginally at bedtime. 20 g 0  . Vitamin D, Ergocalciferol, (DRISDOL)  1.25 MG (50000 UT) CAPS capsule Take 1 capsule (50,000 Units total) by mouth every 7 (seven) days. 12 capsule 0  . [DISCONTINUED] Norethindrone Acetate-Ethinyl Estradiol (JUNEL,LOESTRIN,MICROGESTIN) 1.5-30 MG-MCG tablet Take 1 tablet by mouth daily. 1 Package 11   No current facility-administered medications on file prior to visit.     BP (!) 144/107   Pulse 90   Temp (!) 96 F (35.6 C)        Objective:   Physical Exam        Assessment & Plan:

## 2019-01-24 NOTE — Telephone Encounter (Signed)
Appt scheduled

## 2019-01-24 NOTE — Patient Instructions (Addendum)
For recent symptoms of abdomen pain, and fatigue placed future labs to get done tomorrow. These area cbc, cmp, lipase, h pylori breat test, ua, urine culture, vitamin D and tsh.  Rx famotidine sent to pt pharmacy as well as zofran for nausea and vomiting. Recommend hydrate well and bland diet.  Depending on lab results and symptoms response may consider imaging studies.  Follow up in 7 days or as needed

## 2019-01-25 ENCOUNTER — Other Ambulatory Visit (INDEPENDENT_AMBULATORY_CARE_PROVIDER_SITE_OTHER): Payer: 59

## 2019-01-25 ENCOUNTER — Encounter: Payer: Self-pay | Admitting: Medical

## 2019-01-25 DIAGNOSIS — E785 Hyperlipidemia, unspecified: Secondary | ICD-10-CM | POA: Diagnosis not present

## 2019-01-25 DIAGNOSIS — R109 Unspecified abdominal pain: Secondary | ICD-10-CM | POA: Diagnosis not present

## 2019-01-25 DIAGNOSIS — R5383 Other fatigue: Secondary | ICD-10-CM | POA: Diagnosis not present

## 2019-01-25 LAB — COMPREHENSIVE METABOLIC PANEL
ALT: 15 U/L (ref 0–35)
AST: 16 U/L (ref 0–37)
Albumin: 4.3 g/dL (ref 3.5–5.2)
Alkaline Phosphatase: 59 U/L (ref 39–117)
BUN: 18 mg/dL (ref 6–23)
CO2: 30 mEq/L (ref 19–32)
Calcium: 9.6 mg/dL (ref 8.4–10.5)
Chloride: 104 mEq/L (ref 96–112)
Creatinine, Ser: 0.72 mg/dL (ref 0.40–1.20)
GFR: 85.43 mL/min (ref >60.00)
Glucose, Bld: 89 mg/dL (ref 70–99)
Potassium: 3.9 mEq/L (ref 3.5–5.1)
Sodium: 140 mEq/L (ref 135–145)
Total Bilirubin: 0.6 mg/dL (ref 0.2–1.2)
Total Protein: 6.9 g/dL (ref 6.0–8.3)

## 2019-01-25 LAB — POC URINALSYSI DIPSTICK (AUTOMATED)
Bilirubin, UA: NEGATIVE
Blood, UA: NEGATIVE
Glucose, UA: NEGATIVE
Ketones, UA: NEGATIVE
Leukocytes, UA: NEGATIVE
Nitrite, UA: NEGATIVE
Protein, UA: NEGATIVE
Spec Grav, UA: 1.02 (ref 1.010–1.025)
Urobilinogen, UA: 0.2 E.U./dL
pH, UA: 6 (ref 5.0–8.0)

## 2019-01-25 LAB — CBC WITH DIFFERENTIAL/PLATELET
Basophils Absolute: 0 10*3/uL (ref 0.0–0.1)
Basophils Relative: 0.7 % (ref 0.0–3.0)
Eosinophils Absolute: 0.2 10*3/uL (ref 0.0–0.7)
Eosinophils Relative: 3.3 % (ref 0.0–5.0)
HCT: 38.2 % (ref 36.0–46.0)
Hemoglobin: 12.7 g/dL (ref 12.0–15.0)
Lymphocytes Relative: 35.4 % (ref 12.0–46.0)
Lymphs Abs: 1.8 10*3/uL (ref 0.7–4.0)
MCHC: 33.3 g/dL (ref 30.0–36.0)
MCV: 84.5 fl (ref 78.0–100.0)
Monocytes Absolute: 0.3 10*3/uL (ref 0.1–1.0)
Monocytes Relative: 6.9 % (ref 3.0–12.0)
Neutro Abs: 2.7 10*3/uL (ref 1.4–7.7)
Neutrophils Relative %: 53.7 % (ref 43.0–77.0)
Platelets: 225 10*3/uL (ref 150.0–400.0)
RBC: 4.52 Mil/uL (ref 3.87–5.11)
RDW: 13.6 % (ref 11.5–15.5)
WBC: 5 10*3/uL (ref 4.0–10.5)

## 2019-01-25 LAB — LIPASE: Lipase: 34 U/L (ref 11.0–59.0)

## 2019-01-25 LAB — LIPID PANEL
Cholesterol: 188 mg/dL (ref 0–200)
HDL: 81.8 mg/dL (ref >39.00)
LDL Cholesterol: 93 mg/dL (ref 0–99)
NonHDL: 105.85
Total CHOL/HDL Ratio: 2
Triglycerides: 65 mg/dL (ref 0.0–149.0)
VLDL: 13 mg/dL (ref 0.0–40.0)

## 2019-01-25 LAB — T4, FREE: Free T4: 0.84 ng/dL (ref 0.60–1.60)

## 2019-01-25 LAB — TSH: TSH: 1.52 u[IU]/mL (ref 0.35–4.50)

## 2019-01-26 ENCOUNTER — Other Ambulatory Visit (INDEPENDENT_AMBULATORY_CARE_PROVIDER_SITE_OTHER): Payer: 59

## 2019-01-26 ENCOUNTER — Telehealth: Payer: Self-pay | Admitting: Medical

## 2019-01-26 DIAGNOSIS — R5383 Other fatigue: Secondary | ICD-10-CM

## 2019-01-26 LAB — IRON: Iron: 101 ug/dL (ref 42–145)

## 2019-01-26 MED ORDER — AMOXICILLIN 500 MG PO CAPS: ORAL_CAPSULE | ORAL | 0 refills | Status: DC

## 2019-01-26 MED ORDER — OMEPRAZOLE 40 MG PO CPDR: 40.0000 mg | DELAYED_RELEASE_CAPSULE | Freq: Every day | ORAL | 3 refills | Status: DC

## 2019-01-26 MED ORDER — CLARITHROMYCIN ER 500 MG PO TB24: 1000.0000 mg | ORAL_TABLET | Freq: Every day | ORAL | 0 refills | Status: DC

## 2019-01-26 NOTE — Telephone Encounter (Signed)
Rx antibiotic sent for h pylori.

## 2019-01-26 NOTE — Progress Notes (Signed)
Your iron level is normal

## 2019-01-27 IMAGING — MG MM DIGITAL DIAGNOSTIC BILAT W/ TOMO W/ CAD
8 series · 8 of 24 positions shown · non-contrast
Comparison: Previous exam(s).

CLINICAL DATA: Diffuse medial and lateral right breast pain for the
past 2 months.

EXAM:
DIGITAL DIAGNOSTIC BILATERAL MAMMOGRAM WITH CAD AND TOMO

[L MLO synth-2D]
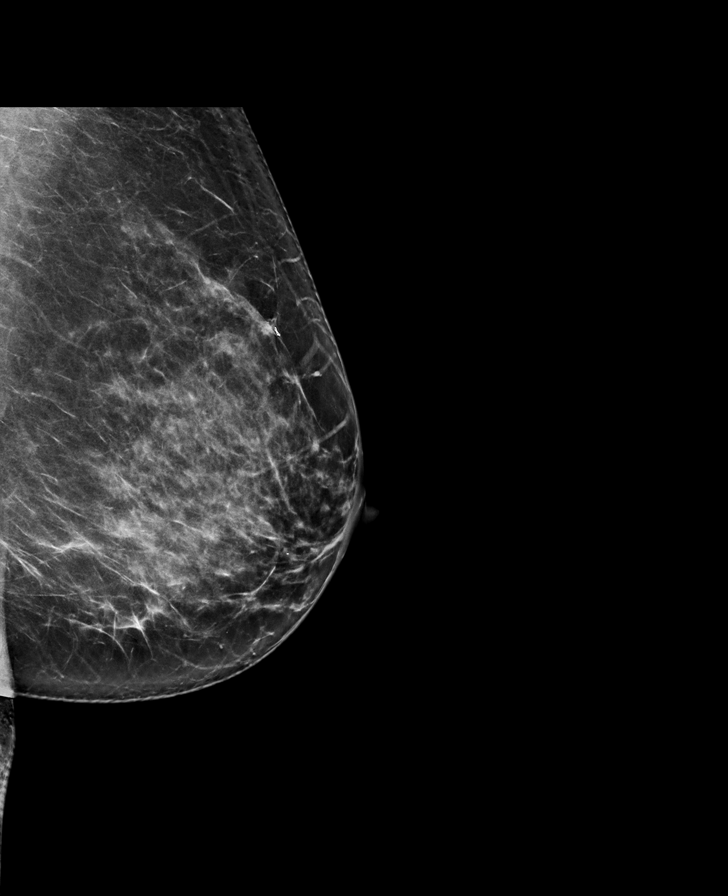

[R MLO synth-2D]
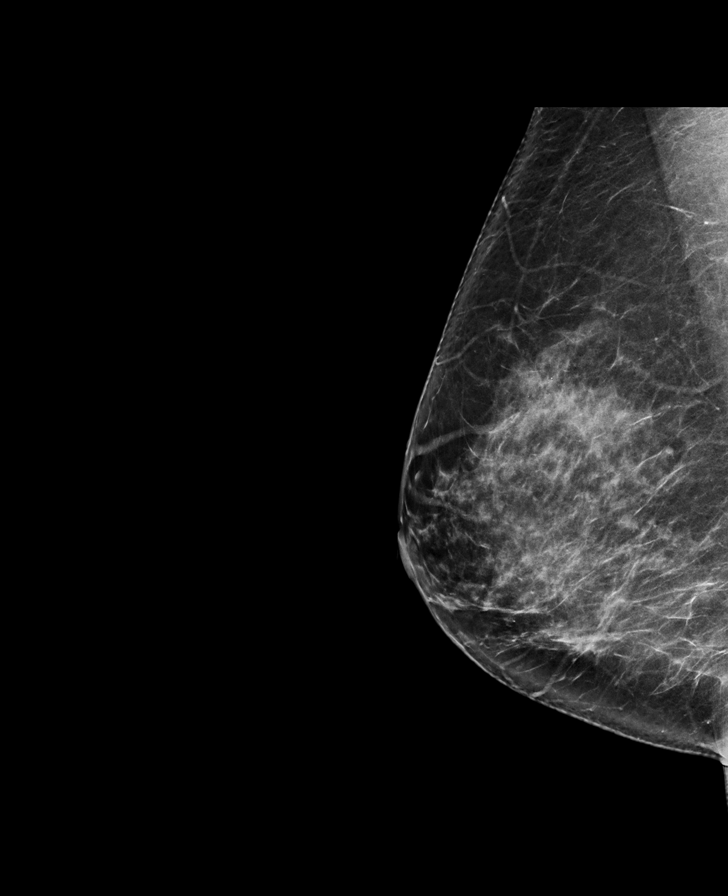

[R CC synth-2D]
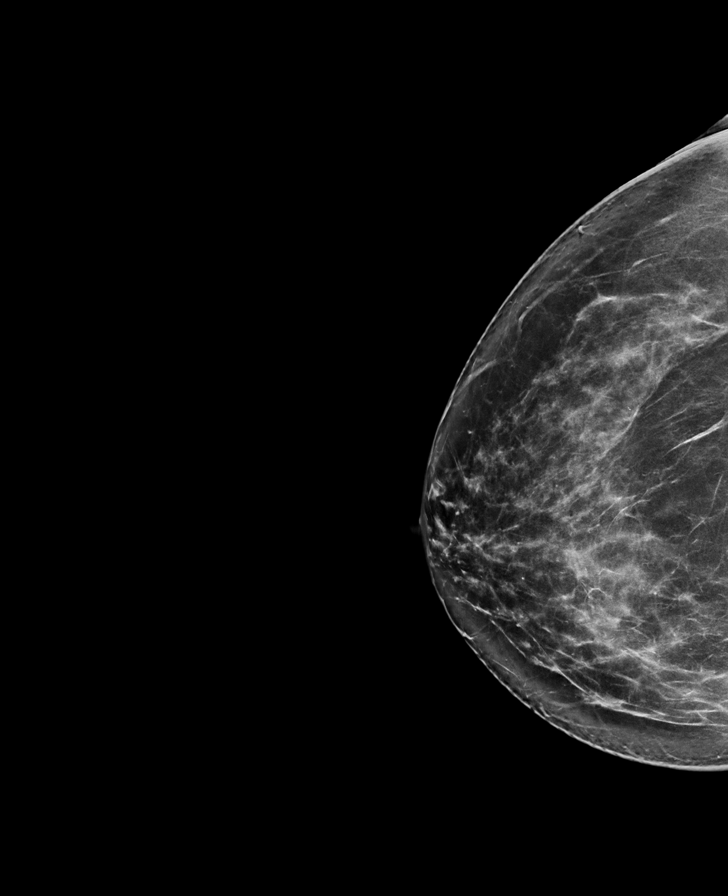

[L CC synth-2D]
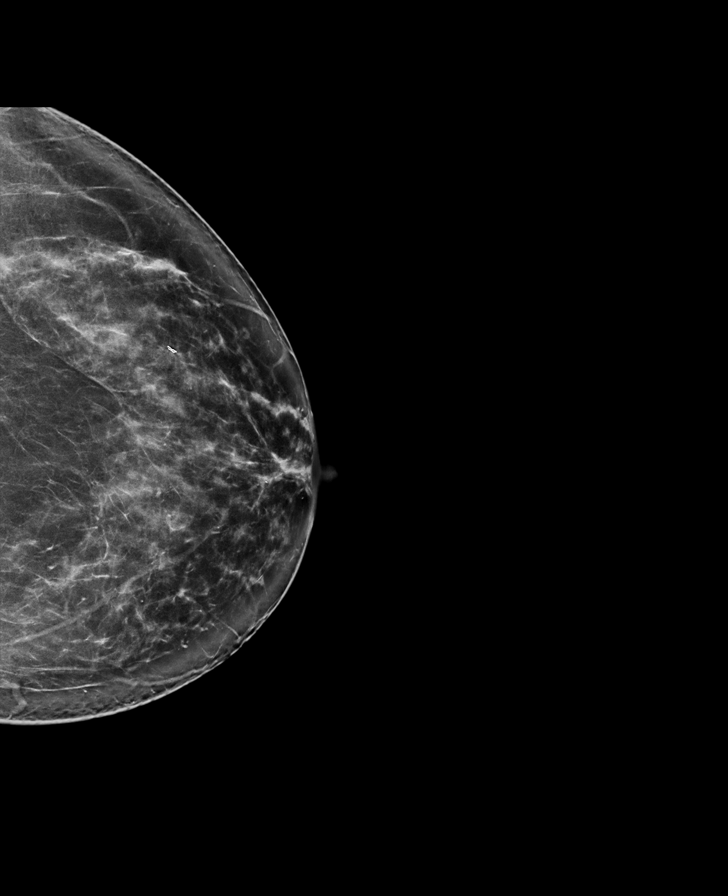

[R MLO tomo · tomo slice 43/86.0]
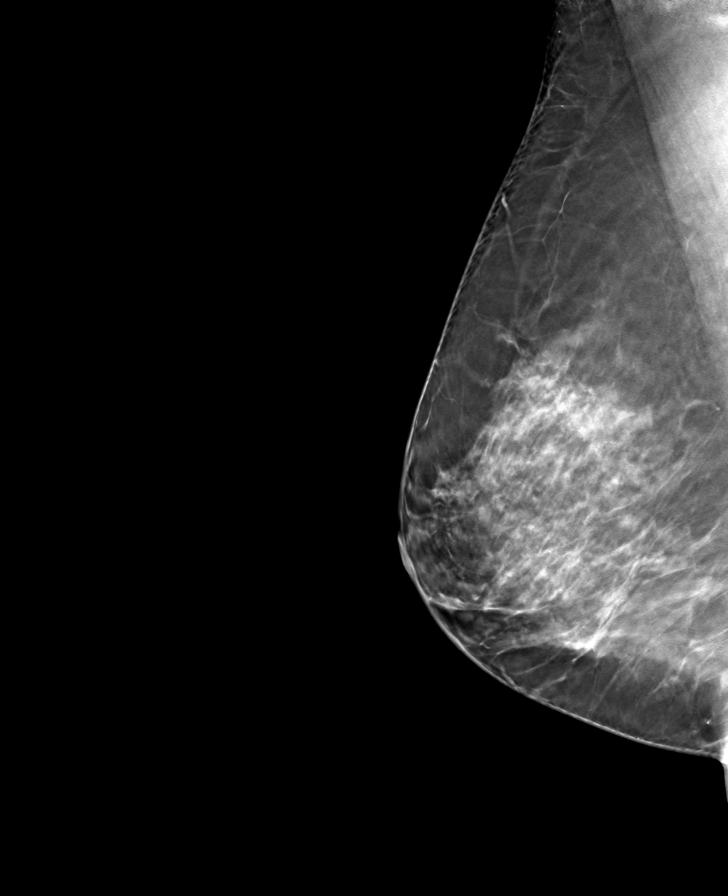

[L CC tomo · tomo slice 43/86.0]
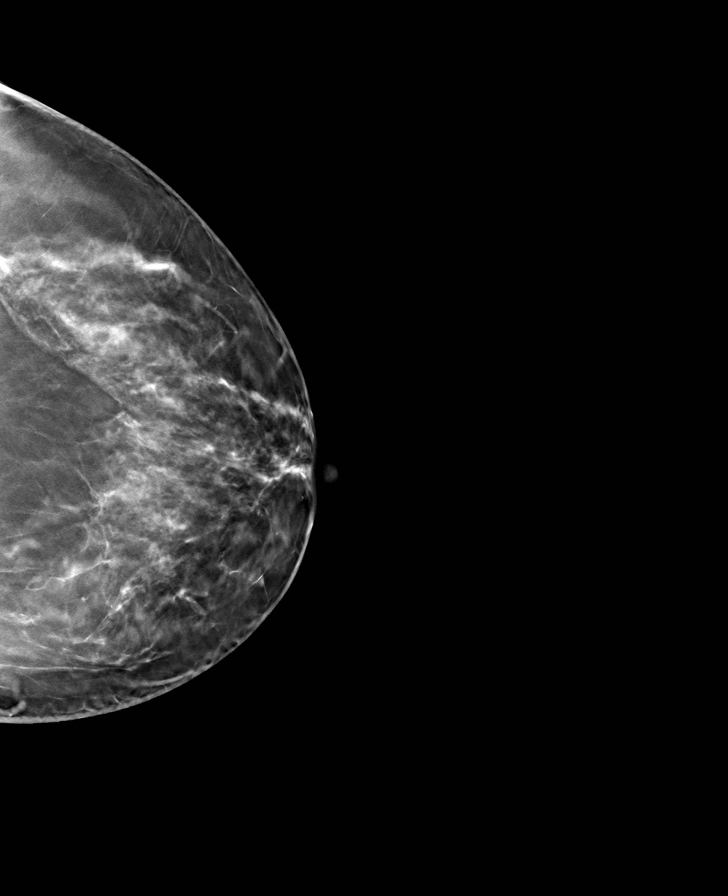

[L MLO tomo · tomo slice 42/83.0]
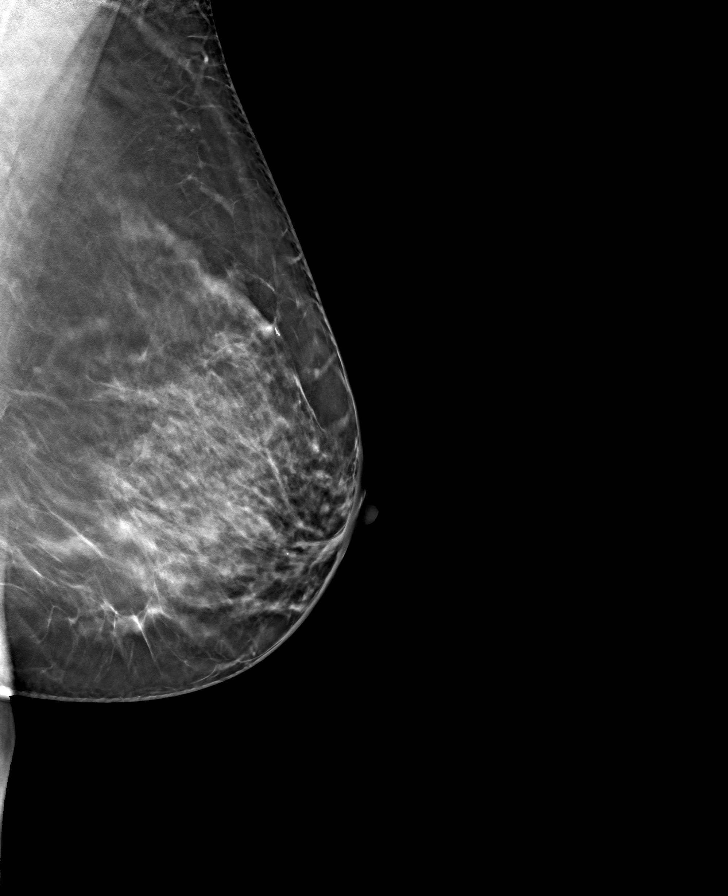

[R CC tomo · tomo slice 43/84.0]
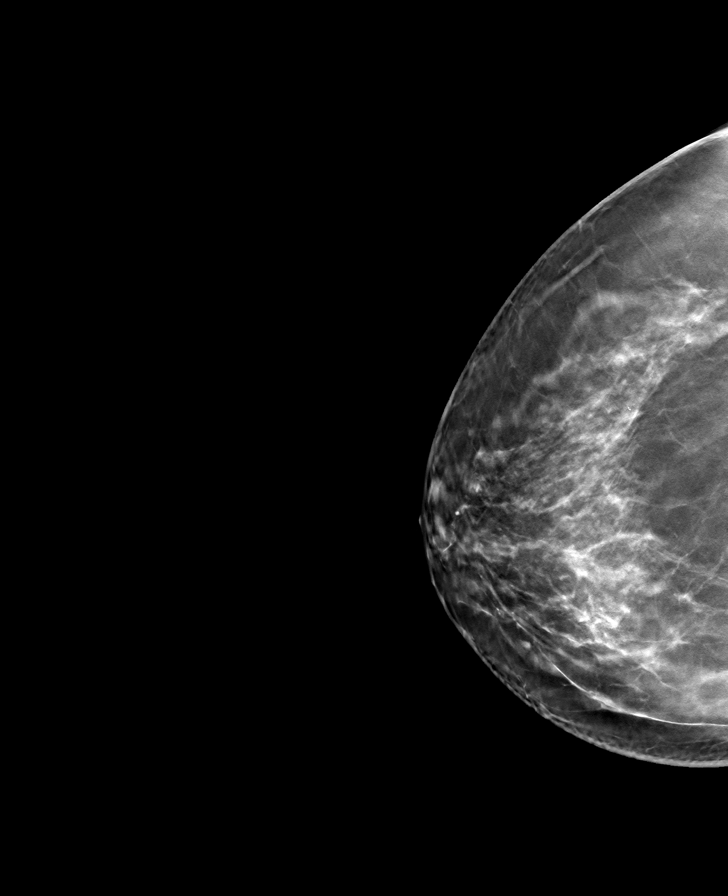

[8 of 24 positions shown; findings below may reference images not displayed]

ACR Breast Density Category c: The breast tissue is heterogeneously
dense, which may obscure small masses.
FINDINGS: Stable mammographic appearance of the breasts no findings suspicious
for malignancy in either breast.

Mammographic images were processed with CAD.
IMPRESSION: No evidence of malignancy.

RECOMMENDATION:
Bilateral screening mammogram in 1 year.

I have discussed the findings and recommendations with the patient.
Results were also provided in writing at the conclusion of the
visit. If applicable, a reminder letter will be sent to the patient
regarding the next appointment.

BI-RADS CATEGORY  1: Negative.

## 2019-01-31 ENCOUNTER — Encounter: Payer: Self-pay | Admitting: Medical

## 2019-02-04 LAB — EXTRA SPECIMEN

## 2019-02-04 LAB — URINE CULTURE
MICRO NUMBER:: 715940
SPECIMEN QUALITY:: ADEQUATE

## 2019-02-04 LAB — VITAMIN D 1,25 DIHYDROXY
Vitamin D 1, 25 (OH)2 Total: 47 pg/mL (ref 18–72)
Vitamin D2 1, 25 (OH)2: 19 pg/mL
Vitamin D3 1, 25 (OH)2: 28 pg/mL

## 2019-02-04 LAB — H. PYLORI BREATH TEST: H. pylori Breath Test: DETECTED — AB

## 2019-02-17 ENCOUNTER — Other Ambulatory Visit: Payer: Self-pay | Admitting: Medical

## 2019-02-24 ENCOUNTER — Encounter: Payer: Self-pay | Admitting: Gastroenterology

## 2019-03-02 ENCOUNTER — Other Ambulatory Visit: Payer: Self-pay | Admitting: Medical

## 2019-03-16 ENCOUNTER — Other Ambulatory Visit: Payer: Self-pay

## 2019-03-16 ENCOUNTER — Ambulatory Visit (AMBULATORY_SURGERY_CENTER): Payer: Self-pay | Admitting: *Deleted

## 2019-03-16 VITALS — Temp 96.6°F | Ht 63.0 in | Wt 130.0 lb

## 2019-03-16 DIAGNOSIS — Z1211 Encounter for screening for malignant neoplasm of colon: Secondary | ICD-10-CM

## 2019-03-16 MED ORDER — SUPREP BOWEL PREP KIT 17.5-3.13-1.6 GM/177ML PO SOLN: ORAL | 0 refills | Status: DC

## 2019-03-16 NOTE — Progress Notes (Signed)
No egg or soy allergy  No home oxygen use or problems with anesthesia  No medications for weight loss taken  emmi information given $15 off Suprep coupon given  Pt is aware that care partner will wait in the car during procedure; if they feel like they will be too hot to wait in the car; they may wait in the lobby.  We want them to wear a mask (we do not have any that we can provide them), practice social distancing, and we will check their temperatures when they get here.  I did remind patient that their care partner needs to stay in the parking lot the entire time. Pt will wear mask into building.  Pt answers all questions in English, but prefers Spanish prep instructions

## 2019-03-17 ENCOUNTER — Encounter: Payer: Self-pay | Admitting: Gastroenterology

## 2019-03-24 ENCOUNTER — Telehealth: Payer: Self-pay | Admitting: Gastroenterology

## 2019-03-24 NOTE — Telephone Encounter (Signed)
Pt answered Yes to one question:  Have you been tested for Covid-19 and found to be positive?  Yes, pt tested positive in May, she was asymptomatic, she was retested in June and was negative.

## 2019-03-24 NOTE — Telephone Encounter (Signed)
Left message to call back for Covid questions  Do you now or have you had a fever in the last 14 days?      Do you have any respiratory symptoms of shortness of breath or cough now or in the last 14 days?       Do you have any family members or close contacts with diagnosed or suspected Covid-19 in the past 14 days?     Have you been tested for Covid-19 and found to be positive?      Make pt aware that care partner may come to the lobby during the procedure but will need to provide their own mask.

## 2019-03-27 ENCOUNTER — Ambulatory Visit (AMBULATORY_SURGERY_CENTER): Payer: 59 | Admitting: Gastroenterology

## 2019-03-27 ENCOUNTER — Encounter: Payer: Self-pay | Admitting: Gastroenterology

## 2019-03-27 ENCOUNTER — Other Ambulatory Visit: Payer: Self-pay

## 2019-03-27 VITALS — BP 107/58 | HR 73 | Temp 97.8°F | Resp 12 | Ht 63.0 in | Wt 130.0 lb

## 2019-03-27 DIAGNOSIS — Z1211 Encounter for screening for malignant neoplasm of colon: Secondary | ICD-10-CM | POA: Diagnosis present

## 2019-03-27 DIAGNOSIS — D122 Benign neoplasm of ascending colon: Secondary | ICD-10-CM

## 2019-03-27 HISTORY — PX: COLONOSCOPY: SHX174

## 2019-03-27 MED ORDER — SODIUM CHLORIDE 0.9 % IV SOLN: 500.0000 mL | Freq: Once | INTRAVENOUS | Status: DC

## 2019-03-27 NOTE — Op Note (Signed)
[facility] Patient Name: Chelsea Barrett Procedure Date: 03/27/2019 9:28 AM MRN: [id number] Endoscopist: Milus Banister , MD Age: 53 Referring MD:  Date of Birth: May 18, 1966 Gender: Female Account #: [id number] Procedure:                Colonoscopy Indications:              Screening for colorectal malignant neoplasm Medicines:                Monitored Anesthesia Care Procedure:                Pre-Anesthesia Assessment:                           - Prior to the procedure, a History and Physical                            was performed, and patient medications and                            allergies were reviewed. The patient's tolerance of                            previous anesthesia was also reviewed. The risks                            and benefits of the procedure and the sedation                            options and risks were discussed with the patient.                            All questions were answered, and informed consent                            was obtained. Prior Anticoagulants: The patient has                            taken no previous anticoagulant or antiplatelet                            agents. ASA Grade Assessment: II - A patient with                            mild systemic disease. After reviewing the risks                            and benefits, the patient was deemed in                            satisfactory condition to undergo the procedure.                           After obtaining informed consent, the colonoscope  was passed under direct vision. Throughout the                            procedure, the patient's blood pressure, pulse, and                            oxygen saturations were monitored continuously. The                            Colonoscope was introduced through the anus and                            advanced to the the cecum, identified by                            appendiceal orifice and  ileocecal valve. The                            colonoscopy was performed without difficulty. The                            patient tolerated the procedure well. The quality                            of the bowel preparation was good. The ileocecal                            valve, appendiceal orifice, and rectum were                            photographed. Scope In: 9:34:18 AM Scope Out: 9:49:44 AM Scope Withdrawal Time: 0 hours 11 minutes 59 seconds  Total Procedure Duration: 0 hours 15 minutes 26 seconds  Findings:                 Three pedunculated and sessile polyps were found in                            the ascending colon. The polyps were 2 to 6 mm in                            size. These polyps were removed with a cold snare.                            Resection and retrieval were complete.                           The exam was otherwise without abnormality on                            direct and retroflexion views. Complications:            No immediate complications. Estimated blood loss:  None. Estimated Blood Loss:     Estimated blood loss: none. Impression:               - Three 2 to 6 mm polyps in the ascending colon,                            removed with a cold snare. Resected and retrieved.                           - The examination was otherwise normal on direct                            and retroflexion views. Recommendation:           - Patient has a contact number available for                            emergencies. The signs and symptoms of potential                            delayed complications were discussed with the                            patient. Return to normal activities tomorrow.                            Written discharge instructions were provided to the                            patient.                           - Resume previous diet.                           - Continue present medications.                            - Await pathology results. Likely repeat                            colonoscopy in 3-7 years. Milus Banister, MD 03/27/2019 9:52:04 AM This report has been signed electronically.

## 2019-03-27 NOTE — Patient Instructions (Signed)
Read all handouts given to you y your recovery room nurse.  Thank-youf or choosing [place] for your ehalth[facility] needs today.  YOU HAD AN ENDOSCOPIC PROCEDURE TODAY AT [facility]:   Refer to the procedure report that was given to you for any specific questions about what was found during the examination.  If the procedure report does not answer your questions, please call your gastroenterologist to clarify.  If you requested that your care partner not be given the details of your procedure findings, then the procedure report has been included in a sealed envelope for you to review at your convenience later.  YOU SHOULD EXPECT: Some feelings of bloating in the abdomen. Passage of more gas than usual.  Walking can help get rid of the air that was put into your GI tract during the procedure and reduce the bloating. If you had a lower endoscopy (such as a colonoscopy or flexible sigmoidoscopy) you may notice spotting of blood in your stool or on the toilet paper. If you underwent a bowel prep for your procedure, you may not have a normal bowel movement for a few days.  Please Note:  You might notice some irritation and congestion in your nose or some drainage.  This is from the oxygen used during your procedure.  There is no need for concern and it should clear up in a day or so.  SYMPTOMS TO REPORT IMMEDIATELY:   Following lower endoscopy (colonoscopy or flexible sigmoidoscopy):  Excessive amounts of blood in the stool  Significant tenderness or worsening of abdominal pains  Swelling of the abdomen that is new, acute  Fever of 100F or higher   For urgent or emergent issues, a gastroenterologist can be reached at any hour by calling [phone].   DIET:  We do recommend a small meal at first, but then you may proceed to your regular diet.  Drink plenty of fluids but you should avoid alcoholic beverages for 24 hours.  ACTIVITY:  You should plan to take it easy for the rest of today  and you should NOT DRIVE or use heavy machinery until tomorrow (because of the sedation medicines used during the test).    FOLLOW UP: Our staff will call the number listed on your records 48-72 hours following your procedure to check on you and address any questions or concerns that you may have regarding the information given to you following your procedure. If we do not reach you, we will leave a message.  We will attempt to reach you two times.  During this call, we will ask if you have developed any symptoms of COVID 19. If you develop any symptoms (ie: fever, flu-like symptoms, shortness of breath, cough etc.) before then, please call [phone].  If you test positive for Covid 19 in the 2 weeks post procedure, please call and report this information to [place].    If any biopsies were taken you will be contacted by phone or by letter within the next 1-3 weeks.  Please call us at [phone] if you have not heard about the biopsies in 3 weeks.    SIGNATURES/CONFIDENTIALITY: You and/or your care partner have signed paperwork which will be entered into your electronic medical record.  These signatures attest to the fact that that the information above on your After Visit Summary has been reviewed and is understood.  Full responsibility of the confidentiality of this discharge information lies with you and/or your care-partner.

## 2019-03-27 NOTE — Progress Notes (Signed)
To PACU, VSS. Report to [email or web address] 

## 2019-03-27 NOTE — Progress Notes (Signed)
Pt's states no medical or surgical changes since previsit or office visit.  Vitals CW Temp KA

## 2019-03-27 NOTE — Progress Notes (Signed)
Called to room to assist during endoscopic procedure.  Patient ID and intended procedure confirmed with present staff. Received instructions for my participation in the procedure from the performing physician.  

## 2019-03-29 ENCOUNTER — Telehealth: Payer: Self-pay | Admitting: *Deleted

## 2019-03-29 NOTE — Telephone Encounter (Signed)
1. Have you developed a fever since your procedure? No   2.   Have you had an respiratory symptoms (SOB or cough) since your procedure? no  3.   Have you tested positive for COVID 19 since your procedure no  4.   Have you had any family members/close contacts diagnosed with the COVID 19 since your procedure?  no   If yes to any of these questions please route to Joylene John, RN and Alphonsa Gin, Therapist, sports.  Follow up Call-  Call back number 03/27/2019  Post procedure Call Back phone  # [phone]  Permission to leave phone message Yes  Some recent data might be hidden     Patient questions:  Do you have a fever, pain , or abdominal swelling? No. Pain Score  0 *  Have you tolerated food without any problems? No.  Have you been able to return to your normal activities? Yes.    Do you have any questions about your discharge instructions: Diet   No. Medications  No. Follow up visit  No.  Do you have questions or concerns about your Care? No.  Actions: * If pain score is 4 or above: No action needed, pain <4.

## 2019-03-31 ENCOUNTER — Telehealth: Payer: Self-pay | Admitting: Gastroenterology

## 2019-03-31 ENCOUNTER — Encounter: Payer: Self-pay | Admitting: Gastroenterology

## 2019-03-31 NOTE — Telephone Encounter (Signed)
See MyChart messages.

## 2019-06-28 ENCOUNTER — Encounter: Payer: Self-pay | Admitting: Medical

## 2019-06-28 ENCOUNTER — Ambulatory Visit: Payer: 59 | Admitting: Medical

## 2019-06-28 ENCOUNTER — Other Ambulatory Visit: Payer: Self-pay

## 2019-06-28 VITALS — BP 100/60 | HR 81 | Temp 97.9°F | Resp 18 | Ht 63.0 in | Wt 134.8 lb

## 2019-06-28 DIAGNOSIS — R519 Headache, unspecified: Secondary | ICD-10-CM | POA: Diagnosis not present

## 2019-06-28 DIAGNOSIS — R350 Frequency of micturition: Secondary | ICD-10-CM | POA: Diagnosis not present

## 2019-06-28 DIAGNOSIS — K120 Recurrent oral aphthae: Secondary | ICD-10-CM

## 2019-06-28 DIAGNOSIS — E785 Hyperlipidemia, unspecified: Secondary | ICD-10-CM

## 2019-06-28 DIAGNOSIS — R5383 Other fatigue: Secondary | ICD-10-CM | POA: Diagnosis not present

## 2019-06-28 DIAGNOSIS — R631 Polydipsia: Secondary | ICD-10-CM

## 2019-06-28 LAB — CBC WITH DIFFERENTIAL/PLATELET
Basophils Absolute: 0 10*3/uL (ref 0.0–0.1)
Basophils Relative: 0.7 % (ref 0.0–3.0)
Eosinophils Absolute: 0.3 10*3/uL (ref 0.0–0.7)
Eosinophils Relative: 4.6 % (ref 0.0–5.0)
HCT: 39.2 % (ref 36.0–46.0)
Hemoglobin: 13 g/dL (ref 12.0–15.0)
Lymphocytes Relative: 34.5 % (ref 12.0–46.0)
Lymphs Abs: 2.1 10*3/uL (ref 0.7–4.0)
MCHC: 33.2 g/dL (ref 30.0–36.0)
MCV: 84.8 fl (ref 78.0–100.0)
Monocytes Absolute: 0.5 10*3/uL (ref 0.1–1.0)
Monocytes Relative: 7.4 % (ref 3.0–12.0)
Neutro Abs: 3.3 10*3/uL (ref 1.4–7.7)
Neutrophils Relative %: 52.8 % (ref 43.0–77.0)
Platelets: 239 10*3/uL (ref 150.0–400.0)
RBC: 4.61 Mil/uL (ref 3.87–5.11)
RDW: 13.9 % (ref 11.5–15.5)
WBC: 6.2 10*3/uL (ref 4.0–10.5)

## 2019-06-28 LAB — COMPREHENSIVE METABOLIC PANEL
ALT: 18 U/L (ref 0–35)
AST: 17 U/L (ref 0–37)
Albumin: 4.1 g/dL (ref 3.5–5.2)
Alkaline Phosphatase: 68 U/L (ref 39–117)
BUN: 21 mg/dL (ref 6–23)
CO2: 28 mEq/L (ref 19–32)
Calcium: 9.3 mg/dL (ref 8.4–10.5)
Chloride: 105 mEq/L (ref 96–112)
Creatinine, Ser: 0.58 mg/dL (ref 0.40–1.20)
GFR: 109.45 mL/min (ref >60.00)
Glucose, Bld: 84 mg/dL (ref 70–99)
Potassium: 4.3 mEq/L (ref 3.5–5.1)
Sodium: 140 mEq/L (ref 135–145)
Total Bilirubin: 0.3 mg/dL (ref 0.2–1.2)
Total Protein: 6.7 g/dL (ref 6.0–8.3)

## 2019-06-28 LAB — TSH: TSH: 1.61 u[IU]/mL (ref 0.35–4.50)

## 2019-06-28 LAB — LIPID PANEL
Cholesterol: 209 mg/dL — ABNORMAL HIGH (ref 0–200)
HDL: 76.8 mg/dL (ref >39.00)
LDL Cholesterol: 101 mg/dL — ABNORMAL HIGH (ref 0–99)
NonHDL: 131.85
Total CHOL/HDL Ratio: 3
Triglycerides: 154 mg/dL — ABNORMAL HIGH (ref 0.0–149.0)
VLDL: 30.8 mg/dL (ref 0.0–40.0)

## 2019-06-28 LAB — VITAMIN B12: Vitamin B-12: >1500 pg/mL — ABNORMAL HIGH (ref 211–911)

## 2019-06-28 MED ORDER — CYCLOBENZAPRINE HCL 10 MG PO TABS: ORAL_TABLET | ORAL | 0 refills | Status: DC

## 2019-06-28 MED ORDER — DICLOFENAC SODIUM 75 MG PO TBEC: 75.0000 mg | DELAYED_RELEASE_TABLET | Freq: Two times a day (BID) | ORAL | 0 refills | Status: DC

## 2019-06-28 MED ORDER — MAGIC MOUTHWASH: ORAL | 0 refills | Status: DC

## 2019-06-28 MED FILL — CMPD-LIDO/DIPH/MYLAN 1:1:1: 10 days supply | Qty: 200 | Fill #0

## 2019-06-28 NOTE — Progress Notes (Signed)
   Subjective:    Patient ID: Chelsea Barrett, female    DOB: Dec 10, 1965, 53 y.o.   MRN: [id number]  HPI  Pt in with some blisters in mouth for about 2 weeks. No rash in hands or feet. No ulcers in hands  Pt has headache recently that occurs after meals. She states urinating frequently. She states some excess thirst but no excess hunger.   Pt mentions working 12 hour days 6 days a week. Has some trapezius tenderness. At times ha at end of the day after work. Presently 3/10 ha.  Pt is tired daily basis.  Lmp-  Lat year. Fsh elevated in past.     Review of Systems  Constitutional: Positive for fatigue. Negative for chills.  HENT: Positive for mouth sores. Negative for congestion, ear pain, rhinorrhea, sinus pressure and sinus pain.   Respiratory: Negative for cough, chest tightness and wheezing.   Cardiovascular: Negative for chest pain and palpitations.  Gastrointestinal: Negative for abdominal pain.  Endocrine: Positive for polydipsia and polyuria. Negative for polyphagia.  Genitourinary: Positive for frequency. Negative for difficulty urinating, dysuria, urgency and vaginal bleeding.  Musculoskeletal: Negative for back pain.  Hematological: Negative for adenopathy. Does not bruise/bleed easily.  Psychiatric/Behavioral: Negative for behavioral problems and confusion. The patient is not nervous/anxious.        Stress at work.       Objective:   Physical Exam  General Mental Status- Alert. General Appearance- Not in acute distress.   Skin General: Color- Normal Color. Moisture- Normal Moisture. Mouth- on inspection faint apthous type ulcer appearance lower lip/gum area.   Neck Carotid Arteries- Normal color. Moisture- Normal Moisture. No carotid bruits. No JVD.  Chest and Lung Exam Auscultation: Breath Sounds:-Normal.  Cardiovascular Auscultation:Rythm- Regular. Murmurs & Other Heart Sounds:Auscultation of the heart reveals- No  Murmurs.  Abdomen Inspection:-Inspeection Normal. Palpation/Percussion:Note:No mass. Palpation and Percussion of the abdomen reveal- Non Tender, Non Distended + BS, no rebound or guarding.    Neurologic Cranial Nerve exam:- CN III-XII intact(No nystagmus), symmetric smile. Drift Test:- No drift. Finger to Nose:- Normal/Intact Strength:- 5/5 equal and symmetric strength both upper and lower extremities.      Assessment & Plan:  For apthous ulcer type lesions will rx magic mouthwash.   For headaches, will rx diclofenac and flexeril. Lamonte Sakai may be tension ha. Rx advisement given. Will assess response and see how you do with tx. Then maybe expand work up.  For fatigue and  get below listed labs.  Follow up in 2 weeks or as needed  25 minutes spent with pt. 50% of time spent counseling pt on plan going forward.

## 2019-06-28 NOTE — Patient Instructions (Addendum)
For apthous ulcer type lesions will rx magic mouthwash.   For headaches, will rx diclofenac and flexeril. Chelsea Barrett may be tension ha. Rx advisement given. Will assess response and see how you do with tx. Then maybe expand work up.  For fatigue and increased thirst  get below listed labs.   Follow up in 2 weeks or as needed

## 2019-06-29 ENCOUNTER — Ambulatory Visit: Payer: 59 | Admitting: Medical

## 2019-06-30 ENCOUNTER — Telehealth: Payer: Self-pay | Admitting: Medical

## 2019-06-30 MED ORDER — ATORVASTATIN CALCIUM 10 MG PO TABS: 10.0000 mg | ORAL_TABLET | Freq: Every day | ORAL | 3 refills | Status: DC

## 2019-06-30 NOTE — Telephone Encounter (Signed)
Rx atorvastatin sent to pt pharmacy. 

## 2019-07-02 LAB — VITAMIN D 1,25 DIHYDROXY
Vitamin D 1, 25 (OH)2 Total: 61 pg/mL (ref 18–72)
Vitamin D2 1, 25 (OH)2: <8 pg/mL
Vitamin D3 1, 25 (OH)2: 61 pg/mL

## 2019-07-02 LAB — VITAMIN B1: Vitamin B1 (Thiamine): 15 nmol/L (ref 8–30)

## 2019-07-10 ENCOUNTER — Ambulatory Visit (INDEPENDENT_AMBULATORY_CARE_PROVIDER_SITE_OTHER): Payer: No Typology Code available for payment source | Admitting: Medical

## 2019-07-10 ENCOUNTER — Encounter: Payer: Self-pay | Admitting: Medical

## 2019-07-10 ENCOUNTER — Other Ambulatory Visit: Payer: Self-pay

## 2019-07-10 VITALS — HR 71 | Wt 133.6 lb

## 2019-07-10 DIAGNOSIS — R519 Headache, unspecified: Secondary | ICD-10-CM

## 2019-07-10 DIAGNOSIS — J3489 Other specified disorders of nose and nasal sinuses: Secondary | ICD-10-CM

## 2019-07-10 DIAGNOSIS — R0789 Other chest pain: Secondary | ICD-10-CM | POA: Diagnosis not present

## 2019-07-10 DIAGNOSIS — M791 Myalgia, unspecified site: Secondary | ICD-10-CM | POA: Diagnosis not present

## 2019-07-10 NOTE — Patient Instructions (Addendum)
Get tested for covid and the flu. Let me know if flu test not run.  Chelsea Barrett location phone number.  Zpack, flonase and diclofenac.  If chest pain recurrent and constant then ED evaluation. Can't see in office as have some concern that has might be  covid. She had covid 7 months ago. ? If antibodies persist. New virus and unclear how long antibiodies last. I have heard patients tell me coworkers got covid second time after 6 months. Also heard of occasion rare cases on news of 3 month reinfection.  Did advise to go ahead and try her diclofenac daily to see if can stop any recurrent chest wall pain if msk.  Work note sent to my chart  Follow up 7 days or as needed

## 2019-07-10 NOTE — Progress Notes (Signed)
   Subjective:    Patient ID: Chelsea Barrett, female    DOB: 12/10/1965, 54 y.o.   MRN: [id number]  HPI  Virtual Visit via Video Note  I connected with [place] on 07/10/19 at  3:20 PM EST by a video enabled telemedicine application and verified that I am speaking with the correct person using two identifiers.  Location: Patient: home  Provider: office   I discussed the limitations of evaluation and management by telemedicine and the availability of in person appointments. The patient expressed understanding and agreed to proceed.  History of Present Illness:  Pt in today with sinus pressure, fatigue and diarrhea.  Pt states today feels very tired. She feels pain in her body and has headache past 2 days..   Her feet feel cold.  Mild sinus pressure when she presses on maxillary sinus.  Pt got covid about 7 months ago. Pt did not get flu vaccine this year. Pt states with covid  7 months agojust had some body aches and fatigue.   Pt states this morning transient brief low level chest discomfort around level 5/10. Pt had no associated jaw pain, shoulder pain, or arm pain. No sob or diaphoresis with that pain.   Observations/Objective:  General-no acute distress, pleasant, oriented. Lungs- on inspection lungs appear unlabored. Neck- no tracheal deviation or jvd on inspection. Neuro- gross motor function appears intact. heent- mild maxillary sinus pressure tenderness.   Assessment and Plan: Get tested for covid and the flu. Let me know if flu test not run.  [facility] location phone number.  Zpack, flonase and diclofenac.  If chest pain recurrent and constant then ED evaluation. Can't see in office as have some concern that has might be  covid. She had covid 7 months ago. ? If antibodies persist. New virus and unclear how long antibiodies last. I have heard patients tell me coworkers got covid second time after 6 months. Also heard of occasion  rare cases on news of 3 month reinfection.  Did advise to go ahead and try her diclofenac daily to see if can stop any recurrent chest wall pain if msk.  Work note sent to my chart  Follow up 7 days or as needed  30 minutes spent with pt.  Follow Up Instructions:    I discussed the assessment and treatment plan with the patient. The patient was provided an opportunity to ask questions and all were answered. The patient agreed with the plan and demonstrated an understanding of the instructions.   The patient was advised to call back or seek an in-person evaluation if the symptoms worsen or if the condition fails to improve as anticipated.  I provided 30  minutes of non-face-to-face time during this encounter. Counseled on plan going forward.   Mackie Pai, PA-C   Review of Systems     Objective:   Physical Exam        Assessment & Plan:

## 2019-07-11 ENCOUNTER — Telehealth: Payer: Self-pay | Admitting: Medical

## 2019-07-11 MED ORDER — OSELTAMIVIR PHOSPHATE 75 MG PO CAPS: 75.0000 mg | ORAL_CAPSULE | Freq: Two times a day (BID) | ORAL | 0 refills | Status: DC

## 2019-07-11 NOTE — Telephone Encounter (Signed)
Opened by accident

## 2019-07-11 NOTE — Telephone Encounter (Signed)
Rx tamiflu sent to pt pharmacy for viral sydrome. See my chart note.

## 2019-07-11 NOTE — Telephone Encounter (Signed)
Opened to review 

## 2019-07-12 ENCOUNTER — Ambulatory Visit: Payer: No Typology Code available for payment source | Attending: Internal Medicine

## 2019-07-12 DIAGNOSIS — Z20822 Contact with and (suspected) exposure to covid-19: Secondary | ICD-10-CM

## 2019-07-13 LAB — NOVEL CORONAVIRUS, NAA: SARS-CoV-2, NAA: NOT DETECTED

## 2019-07-28 ENCOUNTER — Telehealth: Payer: Self-pay

## 2019-07-28 NOTE — Telephone Encounter (Signed)
Pt sees Mackie Pai for PCP. Pt would like to change to [facility] for convenience due to living in [place]. Jodi Mourning FNP is accepting new pts. Pt ph [phone].

## 2019-07-28 NOTE — Telephone Encounter (Signed)
Called pt spoke to spouse informed of other providers in region. Pt verbalized appreciation and will check other providers in [place] closer to their home.

## 2019-07-28 NOTE — Telephone Encounter (Signed)
Will you see the note from Nurse practitioner. Inform pt some providers at [place] location that speak spanish.

## 2019-07-28 NOTE — Telephone Encounter (Signed)
As I don't speak Spanish, I think she would be better served to either stay with her provider or consider going to one of the providers at [place]. I think there are 2 there that speak Spanish.

## 2019-08-29 ENCOUNTER — Other Ambulatory Visit: Payer: Self-pay

## 2019-08-29 ENCOUNTER — Ambulatory Visit: Payer: Self-pay | Admitting: Medical

## 2019-08-29 ENCOUNTER — Encounter: Payer: Self-pay | Admitting: Obstetrics & Gynecology

## 2019-08-29 ENCOUNTER — Ambulatory Visit: Payer: BC Managed Care – PPO | Admitting: Obstetrics & Gynecology

## 2019-08-29 VITALS — BP 108/70

## 2019-08-29 DIAGNOSIS — N952 Postmenopausal atrophic vaginitis: Secondary | ICD-10-CM

## 2019-08-29 DIAGNOSIS — N951 Menopausal and female climacteric states: Secondary | ICD-10-CM | POA: Diagnosis not present

## 2019-08-29 DIAGNOSIS — N898 Other specified noninflammatory disorders of vagina: Secondary | ICD-10-CM | POA: Diagnosis not present

## 2019-08-29 DIAGNOSIS — R5383 Other fatigue: Secondary | ICD-10-CM

## 2019-08-29 DIAGNOSIS — E785 Hyperlipidemia, unspecified: Secondary | ICD-10-CM

## 2019-08-29 DIAGNOSIS — Z113 Encounter for screening for infections with a predominantly sexual mode of transmission: Secondary | ICD-10-CM

## 2019-08-29 DIAGNOSIS — R300 Dysuria: Secondary | ICD-10-CM

## 2019-08-29 LAB — WET PREP FOR TRICH, YEAST, CLUE

## 2019-08-29 MED ORDER — ESTRADIOL 0.1 MG/GM VA CREA: 0.2500 | TOPICAL_CREAM | VAGINAL | 4 refills | Status: DC

## 2019-08-29 NOTE — Progress Notes (Signed)
    [address] Chelsea Barrett 14-Aug-1965 [id number]        54 y.o.  G0 Married  RP: Oligomenorrhea with hot flushes/night sweats and vaginal discomfort  HPI: Hot flushes, night sweats with insomnia.  Fatigue and sadness. No major depressive symptoms, no suicidal ideations.  Dryness with IC.  Vaginal itching.  No pelvic pain.  LMPs 09/2018, then a very light period 06/2019.   OB History  Gravida Para Term Preterm AB Living  0 0 0 0 0 0  SAB TAB Ectopic Multiple Live Births  0 0 0 0      Past medical history,surgical history, problem list, medications, allergies, family history and social history were all reviewed and documented in the EPIC chart.   Directed ROS with pertinent positives and negatives documented in the history of present illness/assessment and plan.  Exam:  Vitals:   08/29/19 1536  BP: 108/70   General appearance:  Normal  Abdomen: Normal  Gynecologic exam: Vulva atrophy of menopause, no discrete lesion.  Speculum:  Cervix/Vagina normal.  Wet prep done.  Bimanual exam:  Uterus AV, normal volume, mobile, NT.  No adnexal mass.  Wet prep: Negative U/A:  Yellow clear, Prtn Neg, Nit Neg, WBC 0-5, RBC NS, Few Bacteria.  Pending Urine Culture.   Assessment/Plan:  54 y.o. G0P0000   1. Menopausal syndrome Probably early menopause with menopausal syndrome.  Hot flushes with night sweats, difficulty sleeping, fatigue and vaginal dryness with intercourse.  Will verify an [place] level today.  Because of oligomenorrhea, will follow up with a pelvic ultrasound to rule out any endometrial pathology.  Systemic hormone replacement therapy discussed.  Benefits, risks and usage reviewed.  Patient prefers not to start systemic hormone replacement therapy, will try natural products such as black cohosh and soy foods at this time. [facility] - US Transvaginal Non-OB; Future  2. Fatigue, unspecified type Rule out hypothyroidism with TSH and anemia with a CBC.  Will verify vitamin D level. -  TSH - CBC - VITAMIN D 25 Hydroxy (Vit-D Deficiency, Fractures)  3. Postmenopausal atrophic vaginitis Decision to start on topical treatment with estradiol cream.  Usage reviewed.  Prescription sent to pharmacy.  4. Vaginal itching Wet prep negative.  Patient reassured. - WET PREP FOR [place], YEAST, CLUE  5. Dysuria Urine analysis just mildly perturbed.  Will wait on urine culture to decide if treatment is needed.  Recommend drinking plenty of water. - Urinalysis,Complete w/RFL Culture  6. Hyperlipidemia, unspecified hyperlipidemia type Patient will follow-up fasting for a repeat lipid panel as it was abnormal in December 2020 and she has decreased the bad cholesterol in her diet since then. - Lipid panel; Future  Other orders - estradiol (ESTRACE VAGINAL) 0.1 MG/GM vaginal cream; Place [id number] Applicatorfuls vaginally 2 (two) times a week.  Princess Bruins MD, 3:59 PM 08/29/2019

## 2019-08-30 ENCOUNTER — Encounter: Payer: Self-pay | Admitting: Obstetrics & Gynecology

## 2019-08-30 LAB — CHLAMYDIA PROBE AMP THINPREP: C. trachomatis RNA, TMA: NOT DETECTED

## 2019-08-30 LAB — GC PROBE AMP THINPREP: N. gonorrhoeae RNA, TMA: NOT DETECTED

## 2019-08-30 NOTE — Patient Instructions (Signed)
1. Menopausal syndrome Probably early menopause with menopausal syndrome.  Hot flushes with night sweats, difficulty sleeping, fatigue and vaginal dryness with intercourse.  Will verify an [place] level today.  Because of oligomenorrhea, will follow up with a pelvic ultrasound to rule out any endometrial pathology.  Systemic hormone replacement therapy discussed.  Benefits, risks and usage reviewed.  Patient prefers not to start systemic hormone replacement therapy, will try natural products such as black cohosh and soy foods at this time. [facility] - US Transvaginal Non-OB; Future  2. Fatigue, unspecified type Rule out hypothyroidism with TSH and anemia with a CBC.  Will verify vitamin D level. - TSH - CBC - VITAMIN D 25 Hydroxy (Vit-D Deficiency, Fractures)  3. Postmenopausal atrophic vaginitis Decision to start on topical treatment with estradiol cream.  Usage reviewed.  Prescription sent to pharmacy.  4. Vaginal itching Wet prep negative.  Patient reassured. - WET PREP FOR [place], YEAST, CLUE  5. Dysuria Urine analysis just mildly perturbed.  Will wait on urine culture to decide if treatment is needed.  Recommend drinking plenty of water. - Urinalysis,Complete w/RFL Culture  6. Hyperlipidemia, unspecified hyperlipidemia type Patient will follow-up fasting for a repeat lipid panel as it was abnormal in December 2020 and she has decreased the bad cholesterol in her diet since then. - Lipid panel; Future  Other orders - estradiol (ESTRACE VAGINAL) 0.1 MG/GM vaginal cream; Place [id number] Applicatorfuls vaginally 2 (two) times a week.  Chelsea Barrett, it was a pleasure seeing you today!  I will inform you of your results as soon as they are available.

## 2019-09-01 ENCOUNTER — Other Ambulatory Visit: Payer: Self-pay | Admitting: Anesthesiology

## 2019-09-01 MED ORDER — SULFAMETHOXAZOLE-TRIMETHOPRIM 800-160 MG PO TABS: 1.0000 | ORAL_TABLET | Freq: Two times a day (BID) | ORAL | 0 refills | Status: DC

## 2019-09-02 LAB — URINE CULTURE
MICRO NUMBER:: 10204475
SPECIMEN QUALITY:: ADEQUATE

## 2019-09-02 LAB — URINALYSIS, COMPLETE W/RFL CULTURE
Bilirubin Urine: NEGATIVE
Glucose, UA: NEGATIVE
Hgb urine dipstick: NEGATIVE
Hyaline Cast: NONE SEEN /LPF
Ketones, ur: NEGATIVE
Leukocyte Esterase: NEGATIVE
Nitrites, Initial: NEGATIVE
Protein, ur: NEGATIVE
RBC / HPF: NONE SEEN /HPF (ref 0–2)
Specific Gravity, Urine: 1.02 (ref 1.001–1.03)
pH: 5 (ref 5.0–8.0)

## 2019-09-02 LAB — CULTURE INDICATED

## 2019-09-05 ENCOUNTER — Other Ambulatory Visit: Payer: Self-pay

## 2019-09-05 ENCOUNTER — Telehealth: Payer: Self-pay | Admitting: *Deleted

## 2019-09-05 DIAGNOSIS — E0590 Thyrotoxicosis, unspecified without thyrotoxic crisis or storm: Secondary | ICD-10-CM

## 2019-09-05 NOTE — Telephone Encounter (Signed)
Per result note on 08/29/19 " referral to endocrinology"  Referral placed at [place] they will call to schedule. Blanca informed patient.

## 2019-09-06 NOTE — Telephone Encounter (Signed)
Patient scheduled on 09/07/19 with Dr.Gherge

## 2019-09-07 ENCOUNTER — Encounter: Payer: Self-pay | Admitting: Internal Medicine

## 2019-09-07 ENCOUNTER — Ambulatory Visit: Payer: BC Managed Care – PPO | Admitting: Internal Medicine

## 2019-09-07 ENCOUNTER — Other Ambulatory Visit: Payer: Self-pay

## 2019-09-07 VITALS — BP 100/60 | HR 72 | Ht 63.0 in | Wt 129.0 lb

## 2019-09-07 DIAGNOSIS — E0590 Thyrotoxicosis, unspecified without thyrotoxic crisis or storm: Secondary | ICD-10-CM | POA: Diagnosis not present

## 2019-09-07 NOTE — Progress Notes (Signed)
Patient ID: Chelsea Barrett, female   DOB: [birth year], 54 y.o.   MRN: [id number]   This visit occurred during the SARS-CoV-2 public health emergency.  Safety protocols were in place, including screening questions prior to the visit, additional usage of staff PPE, and extensive cleaning of exam room while observing appropriate contact time as indicated for disinfecting solutions.   HPI  [facility] Chelsea Barrett is a 54 y.o.-year-old female, referred by her Dr. Dellis Barrett , for evaluation and management of thyrotoxicosis.  Patient was found to have a suppressed TSH on 08/29/2019 after she complained of fatigue and HAs in last 2-3 weeks.  At that time, her free T4 and free T3 levels were normal.  Previous TFTs were reviewed back to 2017 and they were normal.  She does not describe having a upper respiratory infection, sinusitis, or the flu, before she obtained the results above.  She did have increased stress as her sister in [place] recently died.  I reviewed pt's thyroid tests: Lab Results  Component Value Date   TSH 0.01 (L) 08/29/2019   TSH 1.61 06/28/2019   TSH 1.52 01/25/2019   TSH 1.50 09/16/2018   TSH 1.12 09/07/2017   TSH 1.76 08/12/2016   TSH 1.41 12/17/2015   TSH 1.64 08/13/2015   FREET4 1.5 08/29/2019   FREET4 0.84 01/25/2019   T3FREE 3.7 08/29/2019   Antithyroid antibodies: No results found for: TSI   Pt denies: - feeling nodules in neck - hoarseness - dysphagia - choking - SOB with lying down She did notice voice change when praying in church/singing. She has occasional difficulty swallowing - not with certain foods.  She mentions: - fatigue - HAs - occasional tightness in chest - hot flushes - insomnia - weight loss - 5 lbs in last 3 mo - hair loss  She denies: - tremors - anxiety - palpitations - hyperdefecation  Pt does have a FH of thyroid ds. In sister: hyperthyroidism. No FH of thyroid cancer. No h/o radiation tx to head or neck.  No seaweed  or kelp, no recent contrast studies. No steroid use. No herbal supplements. + Biotin use - stopped 1 mo ago. On vitamin C, D, MVI, Probiotic.  She was found to be menopausal at last visit with OB/GYN.  Pt. also has a history of COVID-19 infection in 11/2019.  ROS: Constitutional: + see HPI, + excessive urination Eyes: no blurry vision, no xerophthalmia ENT: no sore throat, + see HPI Cardiovascular: no CP/SOB/palpitations/leg swelling Respiratory: no cough/SOB Gastrointestinal: no N/V/D/C Musculoskeletal: no muscle/joint aches Skin: no rashes, + hair loss Neurological: no tremors/numbness/tingling/dizziness, + headaches Psychiatric: no depression/anxiety  Past Medical History:  Diagnosis Date  . Anemia   . Chalazion of both eyes   . Endometrial polyp   . Endometriosis   . GERD (gastroesophageal reflux disease)   . Lumbar herniated disc    L5  . Pain in both lower legs   . Reactive airway disease    exposure to sand blasting  . Urinary frequency   . Vitamin D deficiency    Past Surgical History:  Procedure Laterality Date  . BREAST BIOPSY  2008   No malignancy  . COLONOSCOPY  03/27/2019  . DILATATION & CURETTAGE/HYSTEROSCOPY WITH MYOSURE N/A 11/10/2017   Procedure: DILATATION & CURETTAGE/HYSTEROSCOPY WITH MYOSURE;  Surgeon: Princess Bruins, MD;  Location: [facility];  Service: Gynecology;  Laterality: N/A;  request to follow around 10:15am in [facility] time requests one hour  .  DILATION AND CURETTAGE OF UTERUS  2005  . PELVIC LAPAROSCOPY     Social History   Socioeconomic History  . Marital status: Married    Spouse name: Not on file  . Number of children: 0  . Years of education: Not on file  . Highest education level: Not on file  Occupational History  .  Teacher assistant  Tobacco Use  . Smoking status: Never Smoker  . Smokeless tobacco: Never Used  Substance and Sexual Activity  . Alcohol use: Not Currently    Alcohol/week: 0.0  standard drinks  . Drug use: Not Currently   Social Determinants of Health   Financial Resource Strain:   . Difficulty of Paying Living Expenses:   Food Insecurity:   . Worried About Charity fundraiser in the Last Year:   . Arboriculturist in the Last Year:   Transportation Needs:   . Film/video editor (Medical):   Marland Kitchen Lack of Transportation (Non-Medical):   Physical Activity:   . Days of Exercise per Week:   . Minutes of Exercise per Session:   Stress:   . Feeling of Stress :   Social Connections:   . Frequency of Communication with Friends and Family:   . Frequency of Social Gatherings with Friends and Family:   . Attends Religious Services:   . Active Member of Clubs or Organizations:   . Attends Archivist Meetings:   Marland Kitchen Marital Status:   Intimate Partner Violence:   . Fear of Current or Ex-Partner:   . Emotionally Abused:   Marland Kitchen Physically Abused:   . Sexually Abused:    Current Outpatient Medications on File Prior to Visit  Medication Sig Dispense Refill  . Ascorbic Acid (VITAMIN C PO) Take by mouth daily.    . Multiple Vitamins-Minerals (HAIR SKIN AND NAILS FORMULA PO) Take by mouth daily.    Marland Kitchen VITAMIN D PO Take by mouth daily.    Marland Kitchen VITAMIN E PO Take by mouth daily.    Marland Kitchen estradiol (ESTRACE VAGINAL) 0.1 MG/GM vaginal cream Place [id number] Applicatorfuls vaginally 2 (two) times a week. (Patient not taking: Reported on 09/07/2019) 42.5 g 4  . sulfamethoxazole-trimethoprim (BACTRIM DS) 800-160 MG tablet Take 1 tablet by mouth 2 (two) times daily. (Patient not taking: Reported on 09/07/2019) 6 tablet 0  . [DISCONTINUED] Norethindrone Acetate-Ethinyl Estradiol (JUNEL,LOESTRIN,MICROGESTIN) 1.5-30 MG-MCG tablet Take 1 tablet by mouth daily. 1 Package 11   No current facility-administered medications on file prior to visit.   No Known Allergies Family History  Problem Relation Age of Onset  . Prostate cancer Father 60       Deceased  . Lymphoma Mother 8        Neck-Deceased  . COPD Maternal Grandmother   . Healthy Brother        x5  . Healthy Sister        x8  . Breast cancer Neg Hx   . Colon cancer Neg Hx   . Esophageal cancer Neg Hx   . Rectal cancer Neg Hx   . Stomach cancer Neg Hx     PE: BP 100/60   Pulse 72   Ht 5\' 3"  (1.6 m)   Wt 129 lb (58.5 kg)   SpO2 98%   BMI 22.85 kg/m  Wt Readings from Last 3 Encounters:  09/07/19 129 lb (58.5 kg)  07/10/19 133 lb 9.6 oz (60.6 kg)  06/28/19 134 lb 12.8 oz (61.1 kg)   Constitutional: normal  weight, in NAD Eyes: PERRLA, EOMI, no exophthalmos, no lid lag, no stare ENT: moist mucous membranes, no thyromegaly, no thyroid bruits, no cervical lymphadenopathy Cardiovascular: RRR, No MRG Respiratory: CTA B Gastrointestinal: abdomen soft, NT, ND, BS+ Musculoskeletal: no deformities, strength intact in all 4 Skin: moist, warm, no rashes Neurological: no tremor with outstretched hands, DTR normal in all 4  ASSESSMENT: 1.  Subclinical thyrotoxicosis  PLAN:  1. Patient with a recently found low TSH x1, with normal free T4 and free T3, with thyrotoxic sxs: Mild weight loss, heat intolerance (hot flashes that she is also menopausal), but denies hyperdefecation, palpitations, anxiety.  - she does not appear to have exogenous causes for the low TSH.  She has been on biotin in the past but not in the last month - We discussed that possible causes of thyrotoxicosis are:  Marland Kitchen Graves ds   . Thyroiditis . toxic multinodular goiter/ toxic adenoma (I cannot feel nodules at palpation of her thyroid). - will check the TSH, fT3 and fT4 and also add thyroid stimulating antibodies to screen for Graves' disease.  - If the tests remain abnormal, we may need an uptake and scan to differentiate between the 3 above possible etiologies  - we discussed about possible modalities of treatment for the above conditions, to include methimazole use, radioactive iodine ablation or (last resort) surgery. - we reviewed  possible side effects of methimazole and when to contact us in case we end up starting the medication - we may need to do thyroid ultrasound depending on the results of the uptake and scan (if a cold nodule is present) - I do not feel that we need to add beta blockers at this time, since she is not tachycardic or tremulous - no signs of Graves' ophthalmopathy: she does not have any double vision, blurry vision, eye pain, chemosis. - RTC in 3 months, but likely sooner for repeat labs  Component     Latest Ref Rng & Units 09/07/2019  TSH     0.35 - 4.50 uIU/mL 1.50  T4,Free(Direct)     0.60 - 1.60 ng/dL 0.51 (L)  Triiodothyronine,Free,Serum     2.3 - 4.2 pg/mL 2.6  TSI     <140 % baseline <89   Labs are now normal with exception of a slightly low fT4, c/w resolving thyroiditis. Will recheck her labs at next OV, no intervention needed for now.  Philemon Kingdom, MD PhD [facility]

## 2019-09-07 NOTE — Patient Instructions (Signed)
Please stop at the lab.  If we need to start Methimazole, please stop this  and call us if you develop: - sore throat - fever - yellow skin - dark urine - light colored stools As we will then need to check your blood counts and liver tests.  Please come back for a follow-up appointment in 3 months.   Hipertiroidismo Hyperthyroidism  El hipertiroidismo ocurre cuando la glndula tiroidea est demasiado activa (hiperactiva). La glndula tiroidea es una pequea glndula ubicada en la parte delantera inferior del cuello, justo delante de la trquea. Esta glndula produce hormonas que ayudan a Aeronautical engineer forma en la que el cuerpo [place] los alimentos para obtener energa (metabolismo) as como tambin la funcin cardaca y la funcin cerebral. Estas hormonas tambin juegan un papel para [employer] huesos fuertes. Cuando la tiroides est hiperactiva, produce una cantidad excesiva de una hormona denominada tiroxina. Cules son las causas? Esta afeccin puede ser causada por lo siguiente:  Enfermedad de Berenice Primas. Este es un trastorno en el que el sistema del cuerpo encargado de combatir las enfermedades (sistema inmunitario) ataca la glndula tiroidea. Esta es la causa ms frecuente.  Inflamacin de la glndula tiroidea.  Un tumor en la glndula tiroidea.  Uso de ciertos medicamentos, como los siguientes: ? Reemplazo de hormona tiroidea recetado. ? Suplementos a base de hierbas que imitan a las hormonas tiroideas. ? Terapia con amiodarona.  Bultos slidos o llenos de lquido en la tiroides (ndulos tiroideos).  Ingerir una gran cantidad de yodo de alimentos o medicamentos. Qu incrementa el riesgo? Es ms probable que usted sufra esta afeccin si:  Es mujer.  Tiene antecedentes familiares de afecciones tiroideas.  Fuma tabaco.  [place] un medicamento denominado litio.  Toma medicamentos que afectan el sistema inmunitario (inmunosupresores). Cules son los signos o los  sntomas? Los sntomas de esta afeccin incluyen los siguientes:  Nerviosismo.  Incapacidad para Agricultural engineer.  Prdida de peso sin causa aparente.  Diarrea.  Cambios en la textura del pelo o la piel.  Falta de latidos cardacos o ms latidos cardacos.  Frecuencia cardaca acelerada.  Ausencia de la [employer].  Temblores en las manos.  Fatiga.  Agitacin.  Problemas para dormir.  Agrandamiento de la glndula tiroidea o un bulto en la tiroides (ndulo). Es posible que tambin tenga sntomas de la enfermedad de [place], los cuales pueden incluir:  Ojos saltones.  Ojos secos.  Ojos rojos o hinchados.  Problemas visuales. Cmo se diagnostica? Esta afeccin se puede diagnosticar en funcin de lo siguiente:  Sus sntomas y antecedentes mdicos.  Un examen fsico.  Anlisis de sangre.  Ecografa de la tiroides. En [employer], se utilizan ondas sonoras para generar imgenes de la glndula tiroidea.  Gammagrafa tiroidea. Se inyecta una sustancia radiactiva en una vena y las imgenes muestran la cantidad de yodo presente en la tiroides.  Prueba de captacin de yodo radiactivo (radioactive iodine uptake test, RAIU). Una pequea cantidad de yodo radiactivo se administra por boca para determinar la cantidad de yodo que absorbe la tiroides despus de un determinado perodo de [place]. Cmo se trata? El tratamiento depende de la causa y la gravedad de la afeccin. El tratamiento puede incluir lo siguiente:  Medicamentos para reducir la cantidad de hormona tiroidea que produce su cuerpo.  Tratamiento con yodo radiactivo (terapia con yodo radiactivo). Consiste en tragar Ardelia Mems pequea dosis de yodo radiactivo, en cpsula o lquido, a fin de destruir las clulas tiroideas.  Ciruga para extirpar toda o parte de  la glndula tiroidea. Es posible que necesite tomar medicamentos de reemplazo de hormona tiroidea por el resto de su vida despus de Ardelia Mems ciruga de la  tiroides.  Medicamentos para ayudar a Psychologist, educational. Siga estas indicaciones en su casa:   Tome los medicamentos de venta libre y los recetados solamente como se lo haya indicado el mdico.  No consuma ningn producto que contenga nicotina o tabaco, como cigarrillos y Psychologist, sport and exercise. Si necesita ayuda para dejar de fumar, consulte al [employer].  Siga las indicaciones del mdico con respecto a la dieta. Es posible que le indiquen limitar los alimentos que contengan yodo.  Concurra a todas las visitas de control como se lo haya indicado el mdico. Esto es importante. ? Tendr que hacerse anlisis de sangre peridicamente, de modo que el mdico pueda Electrical engineer. Comunquese con un mdico si:  Los sntomas no mejoran con Dispensing optician.  Tiene fiebre.  Est tomando medicamentos de reemplazo de la hormona tiroidea y: ? Tiene sntomas de depresin. ? Se siente constantemente cansado. ? Aumenta de [place]. Solicite ayuda de inmediato si:  Electronics engineer.  Disminuy su estado de alerta o hay cambios en la conciencia.  Siente dolor abdominal.  Siente mareos.  Tiene latidos cardacos acelerados.  Tiene latidos cardacos irregulares.  Tiene dificultad para respirar. Resumen  La glndula tiroidea es una pequea glndula ubicada en la parte delantera inferior del cuello, justo delante de la trquea.  El hipertiroidismo ocurre cuando la glndula tiroidea est demasiado activa (hiperactiva) y produce una cantidad excesiva de una hormona denominada tiroxina.  La causa ms frecuente es la enfermedad de [place], un trastorno por el cual el sistema inmunitario ataca la glndula tiroidea.  El hipertiroidismo puede causar diferentes sntomas, por ejemplo, prdida de peso inexplicable, nerviosismo, incapacidad de Agricultural engineer o cambios en los latidos cardacos.  El tratamiento puede incluir medicamentos para reducir la cantidad de hormona tiroidea que  produce su cuerpo, terapia con yodo radiactivo, ciruga o medicamentos para controlar los sntomas. Esta informacin no tiene Marine scientist el consejo del mdico. Asegrese de hacerle al mdico cualquier pregunta que tenga. Document Revised: 08/05/2017 Document Reviewed: 08/05/2017 Elsevier Patient Education  [place].  Hyperthyroidism  Hyperthyroidism is when the thyroid gland is too active (overactive). The thyroid gland is a small gland located in the lower front part of the neck, just in front of the windpipe (trachea). This gland makes hormones that help control how the body uses food for energy (metabolism) as well as how the heart and brain function. These hormones also play a role in keeping your bones strong. When the thyroid is overactive, it produces too much of a hormone called thyroxine. What are the causes? This condition may be caused by:  Graves' disease. This is a disorder in which the body's disease-fighting system (immune system) attacks the thyroid gland. This is the most common cause.  Inflammation of the thyroid gland.  A tumor in the thyroid gland.  Use of certain medicines, including: ? Prescription thyroid hormone replacement. ? Herbal supplements that mimic thyroid hormones. ? Amiodarone therapy.  Solid or fluid-filled lumps within your thyroid gland (thyroid nodules).  Taking in a large amount of iodine from foods or medicines. What increases the risk? You are more likely to develop this condition if:  You are female.  You have a family history of thyroid conditions.  You smoke tobacco.  You use a medicine called lithium.  You take medicines  that affect the immune system (immunosuppressants). What are the signs or symptoms? Symptoms of this condition include:  Nervousness.  Inability to tolerate heat.  Unexplained weight loss.  Diarrhea.  Change in the texture of hair or skin.  Heart skipping beats or making extra  beats.  Rapid heart rate.  Loss of menstruation.  Shaky hands.  Fatigue.  Restlessness.  Sleep problems.  Enlarged thyroid gland or a lump in the thyroid (nodule). You may also have symptoms of Graves' disease, which may include:  Protruding eyes.  Dry eyes.  Red or swollen eyes.  Problems with vision. How is this diagnosed? This condition may be diagnosed based on:  Your symptoms and medical history.  A physical exam.  Blood tests.  Thyroid ultrasound. This test involves using sound waves to produce images of the thyroid gland.  A thyroid scan. A radioactive substance is injected into a vein, and images show how much iodine is present in the thyroid.  Radioactive iodine uptake test (RAIU). A small amount of radioactive iodine is given by mouth to see how much iodine the thyroid absorbs after a certain amount of time. How is this treated? Treatment depends on the cause and severity of the condition. Treatment may include:  Medicines to reduce the amount of thyroid hormone your body makes.  Radioactive iodine treatment (radioiodine therapy). This involves swallowing a small dose of radioactive iodine, in capsule or liquid form, to kill thyroid cells.  Surgery to remove part or all of your thyroid gland. You may need to take thyroid hormone replacement medicine for the rest of your life after thyroid surgery.  Medicines to help manage your symptoms. Follow these instructions at home:   Take over-the-counter and prescription medicines only as told by your health care provider.  Do not use any products that contain nicotine or tobacco, such as cigarettes and e-cigarettes. If you need help quitting, ask your health care provider.  Follow any instructions from your health care provider about diet. You may be instructed to limit foods that contain iodine.  Keep all follow-up visits as told by your health care provider. This is important. ? You will need to have  blood tests regularly so that your health care provider can monitor your condition. Contact a health care provider if:  Your symptoms do not get better with treatment.  You have a fever.  You are taking thyroid hormone replacement medicine and you: ? Have symptoms of depression. ? Feel like you are tired all the time. ? Gain weight. Get help right away if:  You have chest pain.  You have decreased alertness or a change in your awareness.  You have abdominal pain.  You feel dizzy.  You have a rapid heartbeat.  You have an irregular heartbeat.  You have difficulty breathing. Summary  The thyroid gland is a small gland located in the lower front part of the neck, just in front of the windpipe (trachea).  Hyperthyroidism is when the thyroid gland is too active (overactive) and produces too much of a hormone called thyroxine.  The most common cause is Graves' disease, a disorder in which your immune system attacks the thyroid gland.  Hyperthyroidism can cause various symptoms, such as unexplained weight loss, nervousness, inability to tolerate heat, or changes in your heartbeat.  Treatment may include medicine to reduce the amount of thyroid hormone your body makes, radioiodine therapy, surgery, or medicines to manage symptoms. This information is not intended to replace advice given to you  by your health care provider. Make sure you discuss any questions you have with your health care provider. Document Revised: 05/28/2017 Document Reviewed: 05/26/2017 Elsevier Patient Education  2020 [employer].

## 2019-09-08 LAB — TEST AUTHORIZATION

## 2019-09-08 LAB — CBC
HCT: 37.5 % (ref 35.0–45.0)
Hemoglobin: 12.5 g/dL (ref 11.7–15.5)
MCH: 27 pg (ref 27.0–33.0)
MCHC: 33.3 g/dL (ref 32.0–36.0)
MCV: 81 fL (ref 80.0–100.0)
MPV: 10.5 fL (ref 7.5–12.5)
Platelets: 333 10*3/uL (ref 140–400)
RBC: 4.63 10*6/uL (ref 3.80–5.10)
RDW: 12.1 % (ref 11.0–15.0)
WBC: 6.9 10*3/uL (ref 3.8–10.8)

## 2019-09-08 LAB — T4, FREE
Free T4: 1.5 ng/dL (ref 0.8–1.8)
Free T4: 0.51 ng/dL — ABNORMAL LOW (ref 0.60–1.60)

## 2019-09-08 LAB — TSH
TSH: 0.01 mIU/L — ABNORMAL LOW
TSH: 1.5 u[IU]/mL (ref 0.35–4.50)

## 2019-09-08 LAB — FOLLICLE STIMULATING HORMONE: FSH: 75.3 m[IU]/mL

## 2019-09-08 LAB — T3, FREE
T3, Free: 3.7 pg/mL (ref 2.3–4.2)
T3, Free: 2.6 pg/mL (ref 2.3–4.2)

## 2019-09-08 LAB — THYROID STIMULATING IMMUNOGLOBULIN: TSI: <89 % baseline (ref <140)

## 2019-09-08 LAB — VITAMIN D 25 HYDROXY (VIT D DEFICIENCY, FRACTURES): Vit D, 25-Hydroxy: 32 ng/mL (ref 30–100)

## 2019-09-13 LAB — THYROID STIMULATING IMMUNOGLOBULIN: TSI: <89 % baseline (ref <140)

## 2019-09-22 ENCOUNTER — Other Ambulatory Visit: Payer: Self-pay

## 2019-09-25 ENCOUNTER — Encounter: Payer: Self-pay | Admitting: Obstetrics & Gynecology

## 2019-09-25 ENCOUNTER — Other Ambulatory Visit: Payer: Self-pay | Admitting: Obstetrics & Gynecology

## 2019-09-25 ENCOUNTER — Other Ambulatory Visit: Payer: Self-pay

## 2019-09-25 ENCOUNTER — Ambulatory Visit: Payer: BC Managed Care – PPO | Admitting: Obstetrics & Gynecology

## 2019-09-25 VITALS — BP 126/78 | Ht 60.0 in | Wt 136.0 lb

## 2019-09-25 DIAGNOSIS — E785 Hyperlipidemia, unspecified: Secondary | ICD-10-CM

## 2019-09-25 DIAGNOSIS — Z01419 Encounter for gynecological examination (general) (routine) without abnormal findings: Secondary | ICD-10-CM

## 2019-09-25 DIAGNOSIS — Z1231 Encounter for screening mammogram for malignant neoplasm of breast: Secondary | ICD-10-CM

## 2019-09-25 DIAGNOSIS — N950 Postmenopausal bleeding: Secondary | ICD-10-CM

## 2019-09-25 DIAGNOSIS — Z780 Asymptomatic menopausal state: Secondary | ICD-10-CM

## 2019-09-25 DIAGNOSIS — N952 Postmenopausal atrophic vaginitis: Secondary | ICD-10-CM

## 2019-09-25 NOTE — Progress Notes (Signed)
[facility] [place] 1966/02/29 [id number]   History:    54 y.o. G0 Married  RP:  Established patient presenting for annual gyn exam   HPI: Patient had hysteroscopy with excision of a polyp and D&C in May 2019.  Postmenopause. Vasomotor Menopausal Sxs improved on [place].   [place] 75.3 on 08/29/2019.  Had PMB 06/2019.   F/U Pelvic US scheduled this Thursday 4/1st.  Using Estrace cream for dryness with IC.  Breasts normal.  Screening mammogram Negative March 2020.  Body mass index 26.56.  Exercising regularly.  Health labs with family physician. Colonoscopy 02/2019.   Past medical history,surgical history, family history and social history were all reviewed and documented in the EPIC chart.  Gynecologic History No LMP recorded. (Menstrual status: Perimenopausal).  Obstetric History OB History  Gravida Para Term Preterm AB Living  0 0 0 0 0 0  SAB TAB Ectopic Multiple Live Births  0 0 0 0       ROS: A ROS was performed and pertinent positives and negatives are included in the history.  GENERAL: No fevers or chills. HEENT: No change in vision, no earache, sore throat or sinus congestion. NECK: No pain or stiffness. CARDIOVASCULAR: No chest pain or pressure. No palpitations. PULMONARY: No shortness of breath, cough or wheeze. GASTROINTESTINAL: No abdominal pain, nausea, vomiting or diarrhea, melena or bright red blood per rectum. GENITOURINARY: No urinary frequency, urgency, hesitancy or dysuria. MUSCULOSKELETAL: No joint or muscle pain, no back pain, no recent trauma. DERMATOLOGIC: No rash, no itching, no lesions. ENDOCRINE: No polyuria, polydipsia, no heat or cold intolerance. No recent change in weight. HEMATOLOGICAL: No anemia or easy bruising or bleeding. NEUROLOGIC: No headache, seizures, numbness, tingling or weakness. PSYCHIATRIC: No depression, no loss of interest in normal activity or change in sleep pattern.     Exam:   BP 126/78   Ht 5' (1.524 m)   Wt 136 lb  (61.7 kg)   BMI 26.56 kg/m   Body mass index is 26.56 kg/m.  General appearance : Well developed well nourished female. No acute distress HEENT: Eyes: no retinal hemorrhage or exudates,  Neck supple, trachea midline, no carotid bruits, no thyroidmegaly Lungs: Clear to auscultation, no rhonchi or wheezes, or rib retractions  Heart: Regular rate and rhythm, no murmurs or gallops Breast:Examined in sitting and supine position were symmetrical in appearance, no palpable masses or tenderness,  no skin retraction, no nipple inversion, no nipple discharge, no skin discoloration, no axillary or supraclavicular lymphadenopathy Abdomen: no palpable masses or tenderness, no rebound or guarding Extremities: no edema or skin discoloration or tenderness  Pelvic: Vulva: Normal             Vagina: No gross lesions or discharge  Cervix: No gross lesions or discharge  Uterus  AV, normal size, shape and consistency, non-tender and mobile  Adnexa  Without masses or tenderness  Anus: Normal   Assessment/Plan:  54 y.o. female for annual exam   1. Encounter for routine gynecological examination with Papanicolaou smear of cervix Normal gynecologic exam.  Pap reflex done.  Breast exam normal.  Will schedule a repeat screening mammogram now.  Colonoscopy September 2020.  Recent health labs normal except the lipid profile.  Patient will repeat a fasting lipid profile tomorrow morning here.  2. Postmenopause Vasomotor menopausal symptoms much improved on black cohosh.  We will continue with it.  Recommend vitamin D supplements, calcium intake of 1200 mg daily and regular weightbearing physical activities.  3. Postmenopausal bleeding Pelvic ultrasound to complete the investigation is schedule on Thursday April 1st here.  4. Postmenopausal atrophic vaginitis Better on Estrace cream since early March.  We will continue.  5. Hyperlipidemia, unspecified hyperlipidemia type We will follow-up fasting tomorrow  morning. - Lipid panel; Future  Princess Bruins MD, 3:43 PM 09/25/2019

## 2019-09-25 NOTE — Patient Instructions (Signed)
1. Encounter for routine gynecological examination with Papanicolaou smear of cervix Normal gynecologic exam.  Pap reflex done.  Breast exam normal.  Will schedule a repeat screening mammogram now.  Colonoscopy September 2020.  Recent health labs normal except the lipid profile.  Patient will repeat a fasting lipid profile tomorrow morning here.  2. Postmenopause Vasomotor menopausal symptoms much improved on black cohosh.  We will continue with it.  Recommend vitamin D supplements, calcium intake of 1200 mg daily and regular weightbearing physical activities.  3. Postmenopausal bleeding Pelvic ultrasound to complete the investigation is schedule on Thursday April 1st here.  4. Postmenopausal atrophic vaginitis Better on Estrace cream since early March.  We will continue.  5. Hyperlipidemia, unspecified hyperlipidemia type We will follow-up fasting tomorrow morning. - Lipid panel; Future  Chelsea Barrett, it was a pleasure seeing you today!  I will inform you of your results as soon as they are available.

## 2019-09-26 ENCOUNTER — Other Ambulatory Visit: Payer: BC Managed Care – PPO

## 2019-09-26 ENCOUNTER — Other Ambulatory Visit: Payer: Self-pay | Admitting: Anesthesiology

## 2019-09-26 DIAGNOSIS — E785 Hyperlipidemia, unspecified: Secondary | ICD-10-CM

## 2019-09-26 DIAGNOSIS — Z01419 Encounter for gynecological examination (general) (routine) without abnormal findings: Secondary | ICD-10-CM

## 2019-09-26 LAB — LIPID PANEL
Cholesterol: 228 mg/dL — ABNORMAL HIGH
HDL: 86 mg/dL
LDL Cholesterol (Calc): 122 mg/dL — ABNORMAL HIGH
Non-HDL Cholesterol (Calc): 142 mg/dL — ABNORMAL HIGH
Total CHOL/HDL Ratio: 2.7 (calc)
Triglycerides: 96 mg/dL

## 2019-09-26 NOTE — Addendum Note (Signed)
Addended by: Thurnell Garbe A on: 09/26/2019 09:17 AM   Modules accepted: Orders

## 2019-09-27 ENCOUNTER — Other Ambulatory Visit: Payer: Self-pay

## 2019-09-27 LAB — PAP IG W/ RFLX HPV ASCU

## 2019-09-28 ENCOUNTER — Ambulatory Visit: Payer: BC Managed Care – PPO | Admitting: Obstetrics & Gynecology

## 2019-09-28 ENCOUNTER — Encounter: Payer: Self-pay | Admitting: Obstetrics & Gynecology

## 2019-09-28 ENCOUNTER — Ambulatory Visit (INDEPENDENT_AMBULATORY_CARE_PROVIDER_SITE_OTHER): Payer: BC Managed Care – PPO

## 2019-09-28 VITALS — BP 120/76

## 2019-09-28 DIAGNOSIS — N950 Postmenopausal bleeding: Secondary | ICD-10-CM | POA: Diagnosis not present

## 2019-09-28 DIAGNOSIS — N951 Menopausal and female climacteric states: Secondary | ICD-10-CM

## 2019-09-28 NOTE — Progress Notes (Signed)
    [name] Chelsea Barrett Dec 04, 1965 [id number]        53 y.o.  G0P0000   RP: PMB x 1 in 06/2019 with h/o Endometrial Polyp for Pelvic US  HPI:  No PMB since 06/2019.  No pelvic pain.  H/O [facility] Endometrial Polyp/Simple Hyperplasia with no atypia in 10/2017.     OB History  Gravida Para Term Preterm AB Living  0 0 0 0 0 0  SAB TAB Ectopic Multiple Live Births  0 0 0 0      Past medical history,surgical history, problem list, medications, allergies, family history and social history were all reviewed and documented in the EPIC chart.   Directed ROS with pertinent positives and negatives documented in the history of present illness/assessment and plan.  Exam:  Vitals:   09/28/19 1107  BP: 120/76   General appearance:  Normal  Pelvic US today: Comparison is made with previous scan April 2020.  T/V images.  Anteverted uterus normal in size and shape measured at 6.71 x 4.06 x 3.14 cm.  A single small subserosal fibroid is present to the left of the uterus it is unchanged in size at 1.43 x 1.08 cm.  Thin symmetrical endometrial lining measured at 3.39 mm.  No mass or thickening or abnormal blood flow seen to the endometrium.  Both ovaries are small with atrophic appearance.  No adnexal mass.  No free fluid in the posterior cul-de-sac.   Assessment/Plan:  54 y.o. G0  1. Postmenopausal bleeding Postmenopausal bleeding x1 in January 2021.  History of endometrial polyp and simple hyperplasia without atypia in May 2019.  Postmenopausal on no hormone replacement therapy.  Pelvic ultrasound findings thoroughly reviewed with patient.  Unchanged very small single uterine fibroid which is subserosal.  Endometrial lining is thin and symmetrical measured at 3.39 mm with no mass or thickening and no abnormal flow.  Both ovaries are small and atrophic.  No adnexal mass or free fluid in the pelvis.  Patient reassured about the findings.  Will observe.  Follow-up annual gynecologic exam next  year.  Princess Bruins MD, 11:12 AM 09/28/2019

## 2019-09-28 NOTE — Patient Instructions (Signed)
  1. Postmenopausal bleeding Postmenopausal bleeding x1 in January 2021.  History of endometrial polyp and simple hyperplasia without atypia in May 2019.  Postmenopausal on no hormone replacement therapy.  Pelvic ultrasound findings thoroughly reviewed with patient.  Unchanged very small single uterine fibroid which is subserosal.  Endometrial lining is thin and symmetrical measured at 3.39 mm with no mass or thickening and no abnormal flow.  Both ovaries are small and atrophic.  No adnexal mass or free fluid in the pelvis.  Patient reassured about the findings.  Will observe.  Follow-up annual gynecologic exam next year.  Nadin, it was a pleasure seeing you today!

## 2019-10-04 ENCOUNTER — Other Ambulatory Visit: Payer: Self-pay | Admitting: Medical

## 2019-10-19 ENCOUNTER — Other Ambulatory Visit: Payer: Self-pay | Admitting: Family Medicine

## 2019-10-19 DIAGNOSIS — R1909 Other intra-abdominal and pelvic swelling, mass and lump: Secondary | ICD-10-CM

## 2019-11-06 ENCOUNTER — Other Ambulatory Visit: Payer: Self-pay

## 2019-11-06 ENCOUNTER — Ambulatory Visit
Admission: RE | Admit: 2019-11-06 | Discharge: 2019-11-06 | Disposition: A | Discharge status: Home / Self Care | Payer: BC Managed Care – PPO | Source: Ambulatory Visit | Attending: Obstetrics & Gynecology | Admitting: Obstetrics & Gynecology

## 2019-11-06 ENCOUNTER — Ambulatory Visit: Payer: BC Managed Care – PPO

## 2019-11-06 DIAGNOSIS — Z1231 Encounter for screening mammogram for malignant neoplasm of breast: Secondary | ICD-10-CM

## 2019-11-08 ENCOUNTER — Other Ambulatory Visit: Payer: Self-pay | Admitting: Obstetrics & Gynecology

## 2019-11-08 DIAGNOSIS — R928 Other abnormal and inconclusive findings on diagnostic imaging of breast: Secondary | ICD-10-CM

## 2019-11-17 ENCOUNTER — Ambulatory Visit
Admission: RE | Admit: 2019-11-17 | Discharge: 2019-11-17 | Disposition: A | Discharge status: Home / Self Care | Payer: BC Managed Care – PPO | Source: Ambulatory Visit | Attending: Obstetrics & Gynecology | Admitting: Obstetrics & Gynecology

## 2019-11-17 ENCOUNTER — Other Ambulatory Visit: Payer: Self-pay

## 2019-11-17 DIAGNOSIS — R928 Other abnormal and inconclusive findings on diagnostic imaging of breast: Secondary | ICD-10-CM

## 2019-12-07 ENCOUNTER — Other Ambulatory Visit: Payer: Self-pay

## 2019-12-07 ENCOUNTER — Ambulatory Visit: Payer: BC Managed Care – PPO | Admitting: Internal Medicine

## 2019-12-07 ENCOUNTER — Encounter: Payer: Self-pay | Admitting: Internal Medicine

## 2019-12-07 VITALS — BP 100/62 | HR 90 | Ht 60.0 in | Wt 137.0 lb

## 2019-12-07 DIAGNOSIS — E0590 Thyrotoxicosis, unspecified without thyrotoxic crisis or storm: Secondary | ICD-10-CM | POA: Diagnosis not present

## 2019-12-07 NOTE — Progress Notes (Signed)
Patient ID: Chelsea Barrett, female   DOB: 07/01/1965, 54 y.o.   MRN: [id number]   This visit occurred during the SARS-CoV-2 public health emergency.  Safety protocols were in place, including screening questions prior to the visit, additional usage of staff PPE, and extensive cleaning of exam room while observing appropriate contact time as indicated for disinfecting solutions.   HPI  [facility] Chelsea Barrett is a 54 y.o.-year-old female, referred by her Dr. 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  Reviewed and addended history: Patient was found to have a suppressed TSH on 08/29/2019 after she complained of fatigue and HAs in last 2-3 weeks.  At that time, her free T4 and free T3 levels were normal.  Previous TFTs were reviewed back to 2017 and they were normal.  She did not describe having a upper respiratory infection, sinusitis, or the flu, before she obtained the results above. She did have increased stress as her sister in [place] died before last visit.  I reviewed pt's thyroid tests: Lab Results  Component Value Date   TSH 1.50 09/07/2019   TSH 0.01 (L) 08/29/2019   TSH 1.61 06/28/2019   TSH 1.52 01/25/2019   TSH 1.50 09/16/2018   TSH 1.12 09/07/2017   TSH 1.76 08/12/2016   TSH 1.41 12/17/2015   TSH 1.64 08/13/2015   FREET4 0.51 (L) 09/07/2019   FREET4 1.5 08/29/2019   FREET4 0.84 01/25/2019   T3FREE 2.6 09/07/2019   T3FREE 3.7 08/29/2019   Antithyroid antibodies: Lab Results  Component Value Date   TSI <89 09/07/2019   TSI <89 08/29/2019    Pt denies: - feeling nodules in neck - hoarseness - dysphagia - choking - SOB with lying down At last visit, she was telling me that she did notice voice change when praying in church/singing and she also had occasional difficulty swallowing.  These resolved.  She also mentions: - fatigue >> improved - HAs  >> resolved - hot flushes >> persistent - insomnia - weight loss - 5 lbs in last 3 mo -  hair loss >> improved  No: - tremors - anxiety - palpitations - hyperdefecation  Pt does have a FH of thyroid ds. In sister: hyperthyroidism. No FH of thyroid cancer. No h/o radiation tx to head or neck.  No seaweed or kelp, no recent contrast studies. No steroid use. No herbal supplements. Off Biotin. On vitamin C, D, MVI, Probiotic.  She was found to be menopausal at last visit with OB/GYN.  Pt. also has a history of COVID-19 infection in 11/2019.  ROS: Constitutional: + see HPI, resolved excessive urination Eyes: no blurry vision, no xerophthalmia ENT: no sore throat, + see HPI Cardiovascular: no CP/SOB/palpitations/leg swelling Respiratory: no cough/SOB Gastrointestinal: no N/V/D/C Musculoskeletal: no muscle/joint aches Skin: no rashes, + hair loss Neurological: no tremors/numbness/tingling/dizziness Psychiatric: no depression/anxiety  Past Medical History:  Diagnosis Date   Anemia    Chalazion of both eyes    Endometrial polyp    Endometriosis    GERD (gastroesophageal reflux disease)    Lumbar herniated disc    L5   Pain in both lower legs    Reactive airway disease    exposure to sand blasting   Urinary frequency    Vitamin D deficiency    Past Surgical History:  Procedure Laterality Date   BREAST BIOPSY  2008   No malignancy   COLONOSCOPY  03/27/2019   DILATATION & CURETTAGE/HYSTEROSCOPY WITH MYOSURE N/A 11/10/2017   Procedure: DILATATION &  CURETTAGE/HYSTEROSCOPY WITH MYOSURE;  Surgeon: Princess Bruins, MD;  Location: [facility];  Service: Gynecology;  Laterality: N/A;  request to follow around 10:15am in [facility] time requests one hour   DILATION AND CURETTAGE OF UTERUS  2005   PELVIC LAPAROSCOPY     Social History   Socioeconomic History   Marital status: Married    Spouse name: Not on file   Number of children: 0   Years of education: Not on file   Highest education level: Not on file   Occupational History    Teacher assistant  Tobacco Use   Smoking status: Never Smoker   Smokeless tobacco: Never Used  Substance and Sexual Activity   Alcohol use: Not Currently    Alcohol/week: 0.0 standard drinks   Drug use: Not Currently   Social Determinants of Health   Financial Resource Strain:    Difficulty of Paying Living Expenses:   Food Insecurity:    Worried About Charity fundraiser in the Last Year:    Arboriculturist in the Last Year:   Transportation Needs:    Film/video editor (Medical):    Lack of Transportation (Non-Medical):   Physical Activity:    Days of Exercise per Week:    Minutes of Exercise per Session:   Stress:    Feeling of Stress :   Social Connections:    Frequency of Communication with Friends and Family:    Frequency of Social Gatherings with Friends and Family:    Attends Religious Services:    Active Member of Clubs or Organizations:    Attends Music therapist:    Marital Status:   Intimate Partner Violence:    Fear of Current or Ex-Partner:    Emotionally Abused:    Physically Abused:    Sexually Abused:    Current Outpatient Medications on File Prior to Visit  Medication Sig Dispense Refill   Ascorbic Acid (VITAMIN C PO) Take by mouth daily.     Multiple Vitamins-Minerals (HAIR SKIN AND NAILS FORMULA PO) Take by mouth daily.     VITAMIN D PO Take by mouth daily.     VITAMIN E PO Take by mouth daily.     [DISCONTINUED] Norethindrone Acetate-Ethinyl Estradiol (JUNEL,LOESTRIN,MICROGESTIN) 1.5-30 MG-MCG tablet Take 1 tablet by mouth daily. 1 Package 11   No current facility-administered medications on file prior to visit.   No Known Allergies Family History  Problem Relation Age of Onset   Prostate cancer Father 52       Deceased   Lymphoma Mother 67       Neck-Deceased   COPD Maternal Grandmother    Healthy Brother        x5   Healthy Sister        x8   Breast cancer  Neg Hx    Colon cancer Neg Hx    Esophageal cancer Neg Hx    Rectal cancer Neg Hx    Stomach cancer Neg Hx     PE: BP 100/62    Pulse 90    Ht 5' (1.524 m)    Wt 137 lb (62.1 kg)    SpO2 98%    BMI 26.76 kg/m  Wt Readings from Last 3 Encounters:  12/07/19 137 lb (62.1 kg)  09/25/19 136 lb (61.7 kg)  09/07/19 129 lb (58.5 kg)   Constitutional: normal weight, in NAD Eyes: PERRLA, EOMI, no exophthalmos, no lid lag, no stare ENT: moist mucous membranes,  no thyromegaly, no thyroid masses palpable, no thyroid bruits, no cervical lymphadenopathy Cardiovascular: RRR, No MRG Respiratory: CTA B Gastrointestinal: abdomen soft, NT, ND, BS+ Musculoskeletal: no deformities, strength intact in all 4 Skin: moist, warm, no rashes Neurological: no tremor with outstretched hands, DTR normal in all 4  ASSESSMENT: 1.  Thyroiditis - resolving  PLAN:  1. Patient with a history of low TSH x1 with normal free T4 and free T3, but with some possible thyrotoxic symptoms: Weight loss, heat intolerance (hot flashes-menopausal), and without palpitations, anxiety, hyper defecation. Her sxs are improved now, except for hot flushes which are still present. -At last visit she did not appear to have any exogenous causes for her thyrotoxicosis but she has been on biotin in the past and it was unclear whether she took this before the abnormal test returned.  She is currently off the medication. -At last visit, we discussed about possible causes for thyrotoxicosis  Graves ds    Thyroiditis  toxic multinodular goiter/ toxic adenoma (I cannot feel any nodules at palpation of her thyroid). -At that time, her thyroid tests were normal with the exception of a slightly low free T4, consistent with a picture of resolving thyroiditis.  We discussed about repeating the tests when she came back but no intervention was needed at that time. -At this visit, we will recheck her TFTs -No need for beta-blockers.  Heart rate 90  today, at the beginning of the appointment, but decreased afterwards -No signs of Graves' ophthalmopathy: No blurry vision, double vision, eye pain, chemosis -I will see her back as needed.  Office Visit on 12/07/2019  Component Date Value Ref Range Status   TSH 12/07/2019 1.70  0.35 - 4.50 uIU/mL Final   Free T4 12/07/2019 0.67  0.60 - 1.60 ng/dL Final   Comment: Specimens from patients who are undergoing biotin therapy and /or ingesting biotin supplements may contain high levels of biotin.  The higher biotin concentration in these specimens interferes with this Free T4 assay.  Specimens that contain high levels  of biotin may cause false high results for this Free T4 assay.  Please interpret results in light of the total clinical presentation of the patient.     T3, Free 12/07/2019 3.2  2.3 - 4.2 pg/mL Final   All her thyroid tests are now normal.  Philemon Kingdom, MD PhD [facility] Endocrinology

## 2019-12-07 NOTE — Patient Instructions (Signed)
Please stop at the lab.  Please return to see me as needed. 

## 2019-12-08 LAB — T3, FREE: T3, Free: 3.2 pg/mL (ref 2.3–4.2)

## 2019-12-08 LAB — TSH: TSH: 1.7 u[IU]/mL (ref 0.35–4.50)

## 2019-12-08 LAB — T4, FREE: Free T4: 0.67 ng/dL (ref 0.60–1.60)

## 2020-09-04 ENCOUNTER — Encounter: Payer: Self-pay | Admitting: Nurse Practitioner

## 2020-09-04 ENCOUNTER — Ambulatory Visit: Payer: BC Managed Care – PPO | Admitting: Nurse Practitioner

## 2020-09-04 ENCOUNTER — Other Ambulatory Visit: Payer: Self-pay

## 2020-09-04 VITALS — BP 116/78

## 2020-09-04 DIAGNOSIS — N6001 Solitary cyst of right breast: Secondary | ICD-10-CM

## 2020-09-04 DIAGNOSIS — N644 Mastodynia: Secondary | ICD-10-CM | POA: Diagnosis not present

## 2020-09-04 NOTE — Progress Notes (Signed)
   Acute Office Visit  Subjective:    Patient ID: Chelsea Barrett, female    DOB: [birth year], 55 y.o.   MRN: [id number]   HPI 55 y.o. presents today for right breast pain that has worsened over the past 3 weeks. She has had mild pain intermittently but says the pain is worse and more frequent. Diagnostic mammogram 10/2019 showed right benign simple cyst @ 9:30 and 4 o'clock, left breast normal.    Review of Systems  Constitutional: Negative.   Hematological: Negative for adenopathy.  Right breast: positive for pain. Negative for nipple discharge, redness, swelling Left breast: normal    Objective:    Physical Exam Constitutional:      Appearance: Normal appearance.  Chest:  Breasts:     Right: Mass and tenderness present. No swelling, inverted nipple, nipple discharge, skin change, axillary adenopathy or supraclavicular adenopathy.     Left: Tenderness present. No swelling, inverted nipple, mass, nipple discharge, skin change, axillary adenopathy or supraclavicular adenopathy.     Lymphadenopathy:     Upper Body:     Right upper body: No supraclavicular or axillary adenopathy.     Left upper body: No supraclavicular or axillary adenopathy.     BP 116/78  Wt Readings from Last 3 Encounters:  12/07/19 137 lb (62.1 kg)  09/25/19 136 lb (61.7 kg)  09/07/19 129 lb (58.5 kg)        Assessment & Plan:   Problem List Items Addressed This Visit   None   Visit Diagnoses    Breast pain    -  Primary   Benign breast cyst in female, right         Plan: Reassurance provided that pain is felt in areas that have previously been identified to have cysts. Left breast did have tenderness near 3 o'clock, no definitive lump felt. Based on previous ultrasound and symptoms this is likely cystic in nature. We will send referral for bilateral breast ultrasound. She is agreeable. She does want to check with her insurance about coverage prior.      Tamela Gammon [facility], 5:00  PM 09/04/2020

## 2020-09-05 ENCOUNTER — Telehealth: Payer: Self-pay | Admitting: *Deleted

## 2020-09-05 DIAGNOSIS — N6001 Solitary cyst of right breast: Secondary | ICD-10-CM

## 2020-09-05 DIAGNOSIS — N644 Mastodynia: Secondary | ICD-10-CM

## 2020-09-05 NOTE — Telephone Encounter (Signed)
Left appointment information on patient's cell voicemail per DPR access note on file. Advised patient to call back with any questions.

## 2020-09-05 NOTE — Telephone Encounter (Signed)
-----   Message from Tamela Gammon, NP sent at 09/04/2020  5:00 PM EST ----- Please send referral for bilateral diagnostic mammogram for worsening pain and history of cysts.

## 2020-09-05 NOTE — Telephone Encounter (Signed)
Patient scheduled at the breast center on 09/16/20 @ 10:20am . Will route to [place] to call and relay message to patient.

## 2020-09-16 ENCOUNTER — Ambulatory Visit
Admission: RE | Admit: 2020-09-16 | Discharge: 2020-09-16 | Disposition: A | Discharge status: Home / Self Care | Payer: BC Managed Care – PPO | Source: Ambulatory Visit | Attending: Nurse Practitioner | Admitting: Nurse Practitioner

## 2020-09-16 ENCOUNTER — Ambulatory Visit: Payer: BC Managed Care – PPO

## 2020-09-16 ENCOUNTER — Other Ambulatory Visit: Payer: Self-pay

## 2020-09-16 ENCOUNTER — Ambulatory Visit: Admission: RE | Admit: 2020-09-16 | Payer: BC Managed Care – PPO | Source: Ambulatory Visit

## 2020-09-16 DIAGNOSIS — N6002 Solitary cyst of left breast: Secondary | ICD-10-CM

## 2020-09-16 DIAGNOSIS — N6001 Solitary cyst of right breast: Secondary | ICD-10-CM

## 2020-09-16 DIAGNOSIS — N644 Mastodynia: Secondary | ICD-10-CM

## 2020-10-31 ENCOUNTER — Other Ambulatory Visit: Payer: Self-pay

## 2020-10-31 ENCOUNTER — Ambulatory Visit (INDEPENDENT_AMBULATORY_CARE_PROVIDER_SITE_OTHER): Payer: BC Managed Care – PPO | Admitting: Obstetrics & Gynecology

## 2020-10-31 ENCOUNTER — Encounter: Payer: Self-pay | Admitting: Obstetrics & Gynecology

## 2020-10-31 ENCOUNTER — Other Ambulatory Visit (HOSPITAL_COMMUNITY)
Admission: RE | Admit: 2020-10-31 | Discharge: 2020-10-31 | Disposition: A | Discharge status: Home / Self Care | Payer: BC Managed Care – PPO | Source: Ambulatory Visit | Attending: Obstetrics & Gynecology | Admitting: Obstetrics & Gynecology

## 2020-10-31 VITALS — Ht 60.0 in | Wt 135.0 lb

## 2020-10-31 DIAGNOSIS — Z780 Asymptomatic menopausal state: Secondary | ICD-10-CM | POA: Diagnosis not present

## 2020-10-31 DIAGNOSIS — Z01419 Encounter for gynecological examination (general) (routine) without abnormal findings: Secondary | ICD-10-CM

## 2020-10-31 DIAGNOSIS — R300 Dysuria: Secondary | ICD-10-CM | POA: Diagnosis not present

## 2020-10-31 LAB — NO CULTURE INDICATED

## 2020-10-31 LAB — URINALYSIS, COMPLETE W/RFL CULTURE
Bacteria, UA: NONE SEEN /HPF
Bilirubin Urine: NEGATIVE
Glucose, UA: NEGATIVE
Hgb urine dipstick: NEGATIVE
Hyaline Cast: NONE SEEN /LPF
Ketones, ur: NEGATIVE
Leukocyte Esterase: NEGATIVE
Nitrites, Initial: NEGATIVE
Protein, ur: NEGATIVE
RBC / HPF: NONE SEEN /HPF (ref 0–2)
Specific Gravity, Urine: 1.021 (ref 1.001–1.035)
WBC, UA: NONE SEEN /HPF (ref 0–5)
pH: 8.5 — ABNORMAL HIGH (ref 5.0–8.0)

## 2020-10-31 NOTE — Progress Notes (Signed)
[place] Apr 23, 1966 [id number]   History:    55 y.o. G0 Married  WC:[id number] presenting for annual gyn exam   GGY:[id number], well on no HRT.  No PMB x normal Pelvic US 09/2019.  No current pelvic pain.  No pain with IC.  Dysuria currently.  Breasts normal. Screening mammogram Benign in March 2021. Body mass index 26.37. Exercising regularly. Health labs with family physician. Colonoscopy 02/2019.   Past medical history,surgical history, family history and social history were all reviewed and documented in the EPIC chart.  Gynecologic History No LMP recorded. (Menstrual status: Perimenopausal).  Obstetric History OB History  Gravida Para Term Preterm AB Living  0 0 0 0 0 0  SAB IAB Ectopic Multiple Live Births  0 0 0 0       ROS: A ROS was performed and pertinent positives and negatives are included in the history.  GENERAL: No fevers or chills. HEENT: No change in vision, no earache, sore throat or sinus congestion. NECK: No pain or stiffness. CARDIOVASCULAR: No chest pain or pressure. No palpitations. PULMONARY: No shortness of breath, cough or wheeze. GASTROINTESTINAL: No abdominal pain, nausea, vomiting or diarrhea, melena or bright red blood per rectum. GENITOURINARY: No urinary frequency, urgency, hesitancy or dysuria. MUSCULOSKELETAL: No joint or muscle pain, no back pain, no recent trauma. DERMATOLOGIC: No rash, no itching, no lesions. ENDOCRINE: No polyuria, polydipsia, no heat or cold intolerance. No recent change in weight. HEMATOLOGICAL: No anemia or easy bruising or bleeding. NEUROLOGIC: No headache, seizures, numbness, tingling or weakness. PSYCHIATRIC: No depression, no loss of interest in normal activity or change in sleep pattern.     Exam:   Ht 5' (1.524 m)   Wt 135 lb (61.2 kg)   BMI 26.37 kg/m   Body mass index is 26.37 kg/m.  General appearance : Well developed well nourished female. No acute distress HEENT: Eyes: no  retinal hemorrhage or exudates,  Neck supple, trachea midline, no carotid bruits, no thyroidmegaly Lungs: Clear to auscultation, no rhonchi or wheezes, or rib retractions  Heart: Regular rate and rhythm, no murmurs or gallops Breast:Examined in sitting and supine position were symmetrical in appearance, no palpable masses or tenderness,  no skin retraction, no nipple inversion, no nipple discharge, no skin discoloration, no axillary or supraclavicular lymphadenopathy Abdomen: no palpable masses or tenderness, no rebound or guarding Extremities: no edema or skin discoloration or tenderness  Pelvic: Vulva: Normal             Vagina: No gross lesions or discharge  Cervix: No gross lesions or discharge.  Pap reflex done.  Uterus  AV, normal size, shape and consistency, non-tender and mobile  Adnexa  Without masses or tenderness  Anus: Normal  Pelvic US 09/2019:Comparison is made with previous scan April 2020.  T/V images.  Anteverted uterus normal in size and shape measured at 6.71 x 4.06 x 3.14 cm.  A single small subserosal fibroid is present to the left of the uterus it is unchanged in size at 1.43 x 1.08 cm.  Thin symmetrical endometrial lining measured at 3.39 mm.  No mass or thickening or abnormal blood flow seen to the endometrium.  Both ovaries are small with atrophic appearance.  No adnexal mass.  No free fluid in the posterior cul-de-sac.  U/A:  Yellow cloudy, Protein, Nitrite Negative, WBC NS, RBC NS, Bacteria NS.  Many amorphous sediments.   Assessment/Plan:  55 y.o. female for annual exam   1. Encounter for routine  gynecological examination with Papanicolaou smear of cervix Normal gynecologic exam in menopause.  Pap reflex done today.  Breast exam normal.  Will schedule a screening mammogram now.  Colonoscopy 2020.  Body mass index 26.37.  Continue with fitness and healthy nutrition.  Health labs with family physician. - Cytology - PAP( [place])  2. Postmenopause Well on no  hormone replacement therapy.  No postmenopausal bleeding.  Recommend vitamin D supplements, calcium intake of 1.5 g/day total and regular weightbearing physical activities.  3. Dysuria No evidence of cystitis on urine analysis.  Patient reassured.  Abdomen*sediments present.  Increased risk of kidney stones.  Recommend good hydration with water. - Urinalysis,Complete w/RFL Culture  Princess Bruins MD, 3:24 PM 10/31/2020

## 2020-11-01 LAB — CYTOLOGY - PAP: Diagnosis: NEGATIVE

## 2020-11-03 ENCOUNTER — Encounter: Payer: Self-pay | Admitting: Obstetrics & Gynecology

## 2021-05-26 ENCOUNTER — Encounter: Payer: Self-pay | Admitting: Nurse Practitioner

## 2021-05-26 ENCOUNTER — Ambulatory Visit: Payer: BC Managed Care – PPO | Admitting: Nurse Practitioner

## 2021-05-26 ENCOUNTER — Other Ambulatory Visit: Payer: Self-pay

## 2021-05-26 VITALS — BP 118/74

## 2021-05-26 DIAGNOSIS — R39198 Other difficulties with micturition: Secondary | ICD-10-CM

## 2021-05-26 DIAGNOSIS — R1030 Lower abdominal pain, unspecified: Secondary | ICD-10-CM

## 2021-05-26 LAB — WET PREP FOR TRICH, YEAST, CLUE

## 2021-05-26 NOTE — Progress Notes (Signed)
   Acute Office Visit  Subjective:    Patient ID: Chelsea Barrett, female    DOB: [birth year], 55 y.o.   MRN: [id number]   HPI 55 y.o. presents today for lower abdominal pain and difficulty starting urine stream. Pain started 5 days ago, is mostly mid lower but seems to be just slightly to the left. She felt like she had to push to urinate and would start with dribbling first. She feels her bladder is emptying fully. Denies burning with urination, frequency, urgency, or vaginal symptoms. Has not noticed any changes in her bowels.    Review of Systems  Constitutional: Negative.   Gastrointestinal:  Positive for abdominal pain (lower). Negative for blood in stool, constipation, diarrhea, nausea and vomiting.  Genitourinary:  Positive for difficulty urinating. Negative for flank pain, frequency, hematuria, urgency, vaginal discharge and vaginal pain.      Objective:    Physical Exam Constitutional:      Appearance: Normal appearance.  Abdominal:     Tenderness: There is abdominal tenderness. There is guarding. There is no right CVA tenderness, left CVA tenderness or rebound.    Genitourinary:    General: Normal vulva.     Vagina: Normal.     Cervix: Normal.     Uterus: Normal.     BP 118/74   LMP 10/02/2018  Wt Readings from Last 3 Encounters:  10/31/20 135 lb (61.2 kg)  12/07/19 137 lb (62.1 kg)  09/25/19 136 lb (61.7 kg)   Wet prep negative UA negative     Assessment & Plan:   Problem List Items Addressed This Visit   None Visit Diagnoses     Lower abdominal pain    -  Primary   Relevant Orders   Urinalysis,Complete w/RFL Culture (Completed)   WET PREP FOR TRICH, YEAST, CLUE (Completed)   US PELVIS TRANSVAGINAL NON-OB (TV ONLY)   Urine Culture   REFLEXIVE URINE CULTURE (Completed)   Difficulty urinating       Relevant Orders   Urinalysis,Complete w/RFL Culture (Completed)   Urine Culture   REFLEXIVE URINE CULTURE (Completed)       Plan: UA and  wet prep unremarkable. Discussed GI versus GYN versus MSK causes. She would like to proceed with pelvic ultrasound. Instructed to go to ER if symptoms become severe and/or she experiences vaginal or rectal bleeding, nausea or vomiting. She does not appear to have an acute abdomen at this time. She is agreeable to plan.    [place], 4:25 PM 05/26/2021

## 2021-05-28 ENCOUNTER — Other Ambulatory Visit: Payer: Self-pay | Admitting: Nurse Practitioner

## 2021-05-28 DIAGNOSIS — N3000 Acute cystitis without hematuria: Secondary | ICD-10-CM

## 2021-05-28 LAB — URINALYSIS, COMPLETE W/RFL CULTURE
Bilirubin Urine: NEGATIVE
Glucose, UA: NEGATIVE
Hgb urine dipstick: NEGATIVE
Hyaline Cast: NONE SEEN /LPF
Ketones, ur: NEGATIVE
Leukocyte Esterase: NEGATIVE
Nitrites, Initial: NEGATIVE
Protein, ur: NEGATIVE
RBC / HPF: NONE SEEN /HPF (ref 0–2)
Specific Gravity, Urine: 1.02 (ref 1.001–1.035)
pH: 7 (ref 5.0–8.0)

## 2021-05-28 LAB — URINE CULTURE
MICRO NUMBER:: 12683637
SPECIMEN QUALITY:: ADEQUATE

## 2021-05-28 LAB — CULTURE INDICATED

## 2021-05-28 MED ORDER — AMOXICILLIN-POT CLAVULANATE 500-125 MG PO TABS
1.0000 | ORAL_TABLET | Freq: Two times a day (BID) | ORAL | 0 refills | Status: AC
Start: 2021-05-28 — End: 2021-06-04

## 2021-05-29 ENCOUNTER — Ambulatory Visit (INDEPENDENT_AMBULATORY_CARE_PROVIDER_SITE_OTHER): Payer: BC Managed Care – PPO

## 2021-05-29 ENCOUNTER — Other Ambulatory Visit: Payer: Self-pay

## 2021-05-29 ENCOUNTER — Encounter: Payer: Self-pay | Admitting: Obstetrics & Gynecology

## 2021-05-29 ENCOUNTER — Ambulatory Visit: Payer: BC Managed Care – PPO | Admitting: Obstetrics & Gynecology

## 2021-05-29 VITALS — BP 110/78 | Temp 98.5°F

## 2021-05-29 DIAGNOSIS — N3000 Acute cystitis without hematuria: Secondary | ICD-10-CM

## 2021-05-29 DIAGNOSIS — Z780 Asymptomatic menopausal state: Secondary | ICD-10-CM | POA: Diagnosis not present

## 2021-05-29 DIAGNOSIS — R9389 Abnormal findings on diagnostic imaging of other specified body structures: Secondary | ICD-10-CM

## 2021-05-29 DIAGNOSIS — R1030 Lower abdominal pain, unspecified: Secondary | ICD-10-CM | POA: Diagnosis not present

## 2021-05-29 DIAGNOSIS — Z789 Other specified health status: Secondary | ICD-10-CM

## 2021-05-29 NOTE — Progress Notes (Signed)
    [place] 1966-03-16 [id number]        55 y.o.  G0P0000   RP: Lower abdominal pain for Pelvic US  HPI: Well on last Annual Gyn exam in 10/2020.  Currently experiencing intermittent lower abdominal pain. Dxed with an acute Cystitis and started on ABTx last evening.  Postmenopausal, well on no HRT, with no current PMB. Using condoms for contraception.   OB History  Gravida Para Term Preterm AB Living  0 0 0 0 0 0  SAB IAB Ectopic Multiple Live Births  0 0 0 0      Past medical history,surgical history, problem list, medications, allergies, family history and social history were all reviewed and documented in the EPIC chart.   Directed ROS with pertinent positives and negatives documented in the history of present illness/assessment and plan.  Exam:  Vitals:   05/29/21 1345  BP: 110/78  Temp: 98.5 F (36.9 C)  TempSrc: Oral   General appearance:  Normal  Pelvic US today: T/V images.  Urinary bladder appears normal with no mass, irregular wall contour or debris seen.  Patient having difficulty urinating.  Anteverted uterus normal size and shape, measured at 7.85 x 4.99 x 3.88 cm.  1 calcified posterior subserosal fibroid seen measuring 1.3 x 1.4 cm.  Symmetrical thickened endometrial lining measured at 9.46 mm.  No obvious mass or cavitary feeder vessels noted.  Both ovaries are atrophic in appearance.  Right ovary with no follicle seen.  Left ovary with a single dominant follicle noted.  No adnexal mass seen.  No free fluid in the pelvis.   Assessment/Plan:  55 y.o. G0P0000   1. Lower abdominal pain Well on last Annual Gyn exam in 10/2020.  Currently experiencing intermittent lower abdominal pain. Dxed with an acute Cystitis and started on ABTx last evening.  Postmenopausal, well on no HRT, with no current PMB. Using condoms for contraception.  Pelvic US findings thoroughly reviewed with patient.  Uterus normal with a single small posterior subserosal fibroid  measured at 1.4 cm.  The endometrial lining is thickened at 9.46 mm and left ovarian single dominant follicle is present.  Patient is possibly perimenopausal instead of menopausal.  Will nonetheless investigate the thickened endometrial lining further with a sonohysterogram.  2. Acute cystitis without hematuria Started on ABTx last night.  3. Thickened endometrium Postmenopausal with an Endometrial line at 9.46 mm.  F/U Sonohysterogram to r/o Endometrial pathology. - [place] Sonohysterogram; Future  4. Menopause Postmenopausal, well on no HRT.  No PMB, but thickened Endometrial line.  F/U Sonohysto.  Pelvic ultrasound showing a single dominant follicle on the left ovary, suggesting perimenopausal status.  Patient therefore informed that the thickened endometrium could be associated with variable estrogen levels.  5. Use of condoms for contraception    Princess Bruins MD, 2:20 PM 05/29/2021

## 2021-06-04 ENCOUNTER — Encounter: Payer: Self-pay | Admitting: Obstetrics & Gynecology

## 2021-07-03 ENCOUNTER — Other Ambulatory Visit: Payer: Self-pay | Admitting: Obstetrics & Gynecology

## 2021-07-03 ENCOUNTER — Other Ambulatory Visit: Payer: Self-pay

## 2021-07-03 ENCOUNTER — Encounter: Payer: Self-pay | Admitting: Obstetrics & Gynecology

## 2021-07-03 ENCOUNTER — Ambulatory Visit: Payer: BC Managed Care – PPO | Admitting: Obstetrics & Gynecology

## 2021-07-03 ENCOUNTER — Ambulatory Visit (INDEPENDENT_AMBULATORY_CARE_PROVIDER_SITE_OTHER): Payer: BC Managed Care – PPO

## 2021-07-03 ENCOUNTER — Other Ambulatory Visit: Payer: BC Managed Care – PPO

## 2021-07-03 VITALS — BP 108/68

## 2021-07-03 DIAGNOSIS — R9389 Abnormal findings on diagnostic imaging of other specified body structures: Secondary | ICD-10-CM

## 2021-07-03 NOTE — Progress Notes (Signed)
° ° °  Chelsea Barrett [birth year] [id number]        56 y.o.  G0P0000   RP: Thickened Endometrium for SonoHysto  HPI: Pelvic US done for Pelvic pain.  On [place] the endometrial line was thickened at 9.46 mm.   OB History  Gravida Para Term Preterm AB Living  0 0 0 0 0 0  SAB IAB Ectopic Multiple Live Births  0 0 0 0      Past medical history,surgical history, problem list, medications, allergies, family history and social history were all reviewed and documented in the EPIC chart.   Directed ROS with pertinent positives and negatives documented in the history of present illness/assessment and plan.  Exam:  There were no vitals filed for this visit. General appearance:  Normal  Pelvic US 05/29/2021:  T/V images.  Urinary bladder appears normal with no mass, irregular wall contour or debris seen.  Patient having difficulty urinating.  Anteverted uterus normal size and shape, measured at 7.85 x 4.99 x 3.88 cm.  1 calcified posterior subserosal fibroid seen measuring 1.3 x 1.4 cm.  Symmetrical thickened endometrial lining measured at 9.46 mm.  No obvious mass or cavitary feeder vessels noted.  Both ovaries are atrophic in appearance.  Right ovary with no follicle seen.  Left ovary with a single dominant follicle noted.  No adnexal mass seen.  No free fluid in the pelvis.    Pelvic US today: Limited vaginal US performed.  Thin symmetrical endometrium noted measuring 3.97 mm.  No abnormal thickening, masses or vascularity noted within the cavity.   Assessment/Plan:  56 y.o. G0P0000   1. Thickened endometrium  Pelvic US done for Pelvic pain.  On [place] the endometrial line was thickened at 9.46 mm.  Resolved pelvic pain/discomfort.  Pelvic US today showed a thin endometrial line at 3.97.  Findings reviewed.  No indication to perform a Sonohysto.  Reassured.  Princess Bruins MD, 3:29 PM 07/03/2021

## 2021-07-13 ENCOUNTER — Encounter: Payer: Self-pay | Admitting: Obstetrics & Gynecology

## 2021-08-15 ENCOUNTER — Other Ambulatory Visit: Payer: Self-pay | Admitting: Obstetrics & Gynecology

## 2021-08-15 DIAGNOSIS — Z1231 Encounter for screening mammogram for malignant neoplasm of breast: Secondary | ICD-10-CM

## 2021-09-17 ENCOUNTER — Ambulatory Visit: Payer: BC Managed Care – PPO

## 2021-09-17 ENCOUNTER — Ambulatory Visit
Admission: RE | Admit: 2021-09-17 | Discharge: 2021-09-17 | Disposition: A | Discharge status: Home / Self Care | Payer: BC Managed Care – PPO | Source: Ambulatory Visit | Attending: Obstetrics & Gynecology | Admitting: Obstetrics & Gynecology

## 2021-09-17 DIAGNOSIS — Z1231 Encounter for screening mammogram for malignant neoplasm of breast: Secondary | ICD-10-CM

## 2021-09-19 ENCOUNTER — Other Ambulatory Visit: Payer: Self-pay | Admitting: Obstetrics & Gynecology

## 2021-09-19 DIAGNOSIS — R928 Other abnormal and inconclusive findings on diagnostic imaging of breast: Secondary | ICD-10-CM

## 2021-10-16 ENCOUNTER — Ambulatory Visit
Admission: RE | Admit: 2021-10-16 | Discharge: 2021-10-16 | Disposition: A | Discharge status: Home / Self Care | Payer: BC Managed Care – PPO | Source: Ambulatory Visit | Attending: Obstetrics & Gynecology | Admitting: Obstetrics & Gynecology

## 2021-10-16 DIAGNOSIS — R928 Other abnormal and inconclusive findings on diagnostic imaging of breast: Secondary | ICD-10-CM

## 2021-10-23 ENCOUNTER — Other Ambulatory Visit: Payer: BC Managed Care – PPO | Admitting: Obstetrics and Gynecology

## 2021-11-05 ENCOUNTER — Ambulatory Visit (INDEPENDENT_AMBULATORY_CARE_PROVIDER_SITE_OTHER): Payer: BC Managed Care – PPO | Admitting: Obstetrics & Gynecology

## 2021-11-05 ENCOUNTER — Encounter: Payer: Self-pay | Admitting: Obstetrics & Gynecology

## 2021-11-05 ENCOUNTER — Other Ambulatory Visit (HOSPITAL_COMMUNITY)
Admission: RE | Admit: 2021-11-05 | Discharge: 2021-11-05 | Disposition: A | Discharge status: Home / Self Care | Payer: BC Managed Care – PPO | Source: Ambulatory Visit | Attending: Obstetrics & Gynecology | Admitting: Obstetrics & Gynecology

## 2021-11-05 VITALS — BP 110/72 | HR 70 | Resp 16 | Ht 59.75 in | Wt 130.0 lb

## 2021-11-05 DIAGNOSIS — Z01419 Encounter for gynecological examination (general) (routine) without abnormal findings: Secondary | ICD-10-CM | POA: Diagnosis present

## 2021-11-05 DIAGNOSIS — Z789 Other specified health status: Secondary | ICD-10-CM

## 2021-11-05 DIAGNOSIS — N951 Menopausal and female climacteric states: Secondary | ICD-10-CM

## 2021-11-05 NOTE — Progress Notes (Signed)
? ? ?Chelsea Barrett 01-May-1966 [id number] ? ? ?History:    56 y.o. . G0 Married ?  ?RP:  Established patient presenting for annual gyn exam  ?  ?HPI: Perimenopause, well on no HRT.  LMP 05/2021.  Pelvic US 07/03/2021 showing a thin endometrial line at 3.97.  No current pelvic pain.  No pain with IC. Pap 10/2020 Neg. Pap reflex done today. Breasts normal.  MMG: bilateral 09-17-21 & left breast u/s 10-16-21 was benign. Body mass index 25.6.  Exercising regularly.  Health labs with family physician. COLONOSCOPY: 03-27-19. ? ?Past medical history,surgical history, family history and social history were all reviewed and documented in the EPIC chart. ? ?Gynecologic History ?Patient's last menstrual period was 10/02/2018. ? ?Obstetric History ?OB History  ?Gravida Para Term Preterm AB Living  ?0 0 0 0 0 0  ?SAB IAB Ectopic Multiple Live Births  ?0 0 0 0    ? ? ? ?ROS: A ROS was performed and pertinent positives and negatives are included in the history. ?GENERAL: No fevers or chills. HEENT: No change in vision, no earache, sore throat or sinus congestion. NECK: No pain or stiffness. CARDIOVASCULAR: No chest pain or pressure. No palpitations. PULMONARY: No shortness of breath, cough or wheeze. GASTROINTESTINAL: No abdominal pain, nausea, vomiting or diarrhea, melena or bright red blood per rectum. GENITOURINARY: No urinary frequency, urgency, hesitancy or dysuria. MUSCULOSKELETAL: No joint or muscle pain, no back pain, no recent trauma. DERMATOLOGIC: No rash, no itching, no lesions. ENDOCRINE: No polyuria, polydipsia, no heat or cold intolerance. No recent change in weight. HEMATOLOGICAL: No anemia or easy bruising or bleeding. NEUROLOGIC: No headache, seizures, numbness, tingling or weakness. PSYCHIATRIC: No depression, no loss of interest in normal activity or change in sleep pattern.  ?  ? ?Exam: ? ? ?BP 110/72   Pulse 70   Resp 16   Ht 4' 11.75" (1.518 m)   Wt 130 lb (59 kg)   LMP 10/02/2018   BMI 25.60 kg/m?   ? ?Body mass index is 25.6 kg/m?. ? ?General appearance : Well developed well nourished female. No acute distress ?HEENT: Eyes: no retinal hemorrhage or exudates,  Neck supple, trachea midline, no carotid bruits, no thyroidmegaly ?Lungs: Clear to auscultation, no rhonchi or wheezes, or rib retractions  ?Heart: Regular rate and rhythm, no murmurs or gallops ?Breast:Examined in sitting and supine position were symmetrical in appearance, no palpable masses or tenderness,  no skin retraction, no nipple inversion, no nipple discharge, no skin discoloration, no axillary or supraclavicular lymphadenopathy ?Abdomen: no palpable masses or tenderness, no rebound or guarding ?Extremities: no edema or skin discoloration or tenderness ? ?Pelvic: Vulva: Normal ?            Vagina: No gross lesions or discharge ? Cervix: No gross lesions or discharge.  Pap reflex done. ? Uterus  AV, normal size, shape and consistency, non-tender and mobile ? Adnexa  Without masses or tenderness ? Anus: Normal ? ?Pelvic US done for Pelvic pain.  On [place] the endometrial line was thickened at 9.46 mm.  Resolved pelvic pain/discomfort after a menstrual period in 05/2021.  Pelvic US 07/03/21 showed a thin endometrial line at 3.97. ? ? ?Assessment/Plan:  56 y.o. female for annual exam  ? ?1. Encounter for routine gynecological examination with Papanicolaou smear of cervix ?Perimenopause, well on no HRT.  LMP 05/2021.  Pelvic US 07/03/2021 showing a thin endometrial line at 3.97.  No current pelvic pain.  No pain with IC. Pap  10/2020 Neg. Pap reflex done today. Breasts normal.  MMG: bilateral 09-17-21 & left breast u/s 10-16-21 was benign. Body mass index 25.6.  Exercising regularly.  Health labs with family physician. COLONOSCOPY: 03-27-19. ?- Cytology - PAP( [place]) ? ?2. Perimenopause ?Perimenopause, well on no HRT.  LMP 05/2021.  Pelvic US 07/03/2021 showing a thin endometrial line at 3.97.  No current pelvic pain.  No pain with IC ? ?3. Use of condoms for  contraception ? ?Other orders ?- Multiple Vitamin (MULTIVITAMIN PO); Take by mouth.  ? ?Princess Bruins MD, 3:49 PM 11/05/2021 ? ?  ?

## 2021-11-10 LAB — CYTOLOGY - PAP: Diagnosis: NEGATIVE

## 2022-04-01 ENCOUNTER — Telehealth: Payer: Self-pay

## 2022-04-01 ENCOUNTER — Ambulatory Visit: Payer: BC Managed Care – PPO | Admitting: Radiology

## 2022-04-01 VITALS — BP 102/76

## 2022-04-01 DIAGNOSIS — N6325 Unspecified lump in the left breast, overlapping quadrants: Secondary | ICD-10-CM | POA: Diagnosis not present

## 2022-04-01 DIAGNOSIS — N958 Other specified menopausal and perimenopausal disorders: Secondary | ICD-10-CM

## 2022-04-01 DIAGNOSIS — R350 Frequency of micturition: Secondary | ICD-10-CM | POA: Diagnosis not present

## 2022-04-01 DIAGNOSIS — N644 Mastodynia: Secondary | ICD-10-CM

## 2022-04-01 DIAGNOSIS — N6320 Unspecified lump in the left breast, unspecified quadrant: Secondary | ICD-10-CM

## 2022-04-01 MED ORDER — ESTRADIOL 0.1 MG/GM VA CREA: 1.0000 g | TOPICAL_CREAM | VAGINAL | 12 refills | Status: DC

## 2022-04-01 NOTE — Telephone Encounter (Signed)
Kerry Dory, NP  [facility] Triage Please refer to the breast center for dx mammo and u/s left breast mass and pain x 3 days.Last mammo 4/23

## 2022-04-01 NOTE — Progress Notes (Signed)
     [place] December 20, 1965 [id number]   History:  56 y.o. G0 presents with complaints  of voiding small amounts, frequency, no dysuria, no urgency. Notices vaginal bulge after voiding. Complains of pain and lump left breast x's 3 days (last mammogram 09/2021) showed cystic area. Pt reports this area is lower than known cyst and painful.   Gynecologic History Patient's last menstrual period was 10/02/2018.     Obstetric History OB History  Gravida Para Term Preterm AB Living  0 0 0 0 0 0  SAB IAB Ectopic Multiple Live Births  0 0 0 0       The following portions of the patient's history were reviewed and updated as appropriate: allergies, current medications, past family history, past medical history, past social history, past surgical history, and problem list.  Review of Systems Pertinent items noted in HPI and remainder of comprehensive ROS otherwise negative.   Past medical history, past surgical history, family history and social history were all reviewed and documented in the EPIC chart.   Exam:  Vitals:   04/01/22 1506  BP: 102/76   There is no height or weight on file to calculate BMI.  General appearance:  Normal  Respiratory  Auscultation:  Clear without wheezing or rhonchi Cardiovascular  Auscultation:  Regular rate, without rubs, murmurs or gallops  Edema/varicosities:  Not grossly evident Breasts: right breast normal without mass, skin or nipple changes or axillary nodes, abnormal mass palpable left breast 3 o'clock.   Pelvic: mildly atrophic vagina and erythematous urethra, no vaginal d/c. No adnexal masses palpated. No prolapse.  Abdomen: soft without tenderness, guarding, mass, rebound or organomegaly. No CVA tenderness or inguinal adenopathy noted. Urine dipstick shows negative for all components.  Micro exam: negative for WBC's or RBC's.   Patient informed chaperone available to be present for breast and pelvic exam. Patient has  requested no chaperone to be present.   Assessment/Plan:   1. Urinary frequency Reassured neg culture, sx may be related to vaginal atrophy - Urinalysis,Complete w/RFL Culture  2. Mass overlapping multiple quadrants of left breast Diagnostic mammo with u/s ordered   3. Genitourinary syndrome of menopause - estradiol (ESTRACE VAGINAL) 0.1 MG/GM vaginal cream; Place 1 g vaginally 3 (three) times a week. At  bedtime  Dispense: 42.5 g; Refill: 12     Treatment per orders - also push fluids, avoid bladder irritants. Instructed she may use Pyridium OTC prn. Call or return to clinic prn if these symptoms worsen or fail to improve as anticipated. Pyelo precautions reviewed with patient.

## 2022-04-01 NOTE — Telephone Encounter (Signed)
Order placed. Appt scheduled at [facility] for Friday, 04/10/22 at 12:50pm to arrive at 12:30pm.    .  Message sent to [facility] to call and inform patient.  Note back from [place] that Per DPR access note on file she left detailed message in voice mail.  I also sent patient a My Chart message informing her.

## 2022-04-10 ENCOUNTER — Other Ambulatory Visit: Payer: BC Managed Care – PPO

## 2022-04-13 ENCOUNTER — Ambulatory Visit
Admission: RE | Admit: 2022-04-13 | Discharge: 2022-04-13 | Disposition: A | Discharge status: Home / Self Care | Payer: BC Managed Care – PPO | Source: Ambulatory Visit | Attending: Radiology | Admitting: Radiology

## 2022-04-13 DIAGNOSIS — N6320 Unspecified lump in the left breast, unspecified quadrant: Secondary | ICD-10-CM

## 2022-04-13 DIAGNOSIS — N644 Mastodynia: Secondary | ICD-10-CM

## 2022-04-23 LAB — URINALYSIS, COMPLETE W/RFL CULTURE
Bacteria, UA: NONE SEEN /HPF
Bilirubin Urine: NEGATIVE
Casts: NONE SEEN /LPF
Crystals: NONE SEEN /HPF
Glucose, UA: NEGATIVE
Hgb urine dipstick: NEGATIVE
Hyaline Cast: NONE SEEN /LPF
Ketones, ur: NEGATIVE
Leukocyte Esterase: NEGATIVE
Nitrites, Initial: NEGATIVE
Protein, ur: NEGATIVE
RBC / HPF: NONE SEEN /HPF (ref 0–2)
Specific Gravity, Urine: 1.015 (ref 1.001–1.035)
WBC, UA: NONE SEEN /HPF (ref 0–5)
Yeast: NONE SEEN /HPF
pH: 7 (ref 5.0–8.0)

## 2022-04-23 LAB — NO CULTURE INDICATED

## 2022-05-06 ENCOUNTER — Telehealth: Payer: Self-pay | Admitting: *Deleted

## 2022-05-06 NOTE — Telephone Encounter (Signed)
Patient called and left message in triage voicemail asking for return call. I called and received voicemail I left message for patient to call.

## 2022-09-14 ENCOUNTER — Other Ambulatory Visit: Payer: Self-pay | Admitting: Obstetrics & Gynecology

## 2022-09-14 DIAGNOSIS — Z1231 Encounter for screening mammogram for malignant neoplasm of breast: Secondary | ICD-10-CM

## 2022-10-29 ENCOUNTER — Ambulatory Visit
Admission: RE | Admit: 2022-10-29 | Discharge: 2022-10-29 | Disposition: A | Discharge status: Home / Self Care | Payer: BC Managed Care – PPO | Source: Ambulatory Visit | Attending: Obstetrics & Gynecology | Admitting: Obstetrics & Gynecology

## 2022-10-29 DIAGNOSIS — Z1231 Encounter for screening mammogram for malignant neoplasm of breast: Secondary | ICD-10-CM

## 2022-11-11 ENCOUNTER — Ambulatory Visit (INDEPENDENT_AMBULATORY_CARE_PROVIDER_SITE_OTHER): Payer: BC Managed Care – PPO | Admitting: Obstetrics & Gynecology

## 2022-11-11 ENCOUNTER — Other Ambulatory Visit (HOSPITAL_COMMUNITY)
Admission: RE | Admit: 2022-11-11 | Discharge: 2022-11-11 | Disposition: A | Discharge status: Home / Self Care | Payer: BC Managed Care – PPO | Source: Ambulatory Visit | Attending: Obstetrics & Gynecology | Admitting: Obstetrics & Gynecology

## 2022-11-11 ENCOUNTER — Encounter: Payer: Self-pay | Admitting: Obstetrics & Gynecology

## 2022-11-11 VITALS — BP 108/72 | HR 73 | Ht 59.75 in | Wt 129.0 lb

## 2022-11-11 DIAGNOSIS — L292 Pruritus vulvae: Secondary | ICD-10-CM | POA: Diagnosis not present

## 2022-11-11 DIAGNOSIS — Z01419 Encounter for gynecological examination (general) (routine) without abnormal findings: Secondary | ICD-10-CM

## 2022-11-11 DIAGNOSIS — Z780 Asymptomatic menopausal state: Secondary | ICD-10-CM

## 2022-11-11 LAB — WET PREP FOR TRICH, YEAST, CLUE

## 2022-11-11 MED ORDER — NYSTATIN-TRIAMCINOLONE 100000-0.1 UNIT/GM-% EX OINT: 1.0000 | TOPICAL_OINTMENT | Freq: Two times a day (BID) | CUTANEOUS | 2 refills | Status: DC

## 2022-11-11 NOTE — Progress Notes (Signed)
Chelsea Barrett 07-Dec-1965 [id number]   History:    57 y.o.  G0 Married   RP:  Established patient presenting for annual gyn exam    HPI: Postmenopause, well on no HRT.  No PMB.  No pelvic pain.  Pt c/o vulvar itching. No pain with IC. Pap 10/2021 Neg. Pap reflex done today. Breasts normal.  Mammo Neg 10/2022. Body mass index 25.4. Exercising regularly.  Health labs with family physician.  COLONOSCOPY: 03-27-19.  Past medical history,surgical history, family history and social history were all reviewed and documented in the EPIC chart.  Gynecologic History Patient's last menstrual period was 10/02/2018.  Obstetric History OB History  Gravida Para Term Preterm AB Living  0 0 0 0 0 0  SAB IAB Ectopic Multiple Live Births  0 0 0 0       ROS: A ROS was performed and pertinent positives and negatives are included in the history. GENERAL: No fevers or chills. HEENT: No change in vision, no earache, sore throat or sinus congestion. NECK: No pain or stiffness. CARDIOVASCULAR: No chest pain or pressure. No palpitations. PULMONARY: No shortness of breath, cough or wheeze. GASTROINTESTINAL: No abdominal pain, nausea, vomiting or diarrhea, melena or bright red blood per rectum. GENITOURINARY: No urinary frequency, urgency, hesitancy or dysuria. MUSCULOSKELETAL: No joint or muscle pain, no back pain, no recent trauma. DERMATOLOGIC: No rash, no itching, no lesions. ENDOCRINE: No polyuria, polydipsia, no heat or cold intolerance. No recent change in weight. HEMATOLOGICAL: No anemia or easy bruising or bleeding. NEUROLOGIC: No headache, seizures, numbness, tingling or weakness. PSYCHIATRIC: No depression, no loss of interest in normal activity or change in sleep pattern.     Exam:   BP 108/72   Pulse 73   Ht 4' 11.75" (1.518 m)   Wt 129 lb (58.5 kg)   LMP 10/02/2018 Comment: sexually active  SpO2 98%   BMI 25.40 kg/m   Body mass index is 25.4 kg/m.  General appearance : Well  developed well nourished female. No acute distress HEENT: Eyes: no retinal hemorrhage or exudates,  Neck supple, trachea midline, no carotid bruits, no thyroidmegaly Lungs: Clear to auscultation, no rhonchi or wheezes, or rib retractions  Heart: Regular rate and rhythm, no murmurs or gallops Breast:Examined in sitting and supine position were symmetrical in appearance, no palpable masses or tenderness,  no skin retraction, no nipple inversion, no nipple discharge, no skin discoloration, no axillary or supraclavicular lymphadenopathy Abdomen: no palpable masses or tenderness, no rebound or guarding Extremities: no edema or skin discoloration or tenderness  Pelvic: Vulva: Normal             Vagina: No gross lesions.  Mild vaginal discharge.  Wet prep done.  Cervix: No gross lesions or discharge.  Pap reflex done.  Uterus  AV, normal size, shape and consistency, non-tender and mobile  Adnexa  Without masses or tenderness  Anus: Normal  Wet prep Neg   Assessment/Plan:  57 y.o. female for annual exam   1. Encounter for routine gynecological examination with Papanicolaou smear of cervix Postmenopause, well on no HRT.  No PMB.  No pelvic pain.  Pt c/o vulvar itching. No pain with IC. Pap 10/2021 Neg. Pap reflex done today. Breasts normal.  Mammo Neg 10/2022. Body mass index 25.4. Exercising regularly.  Health labs with family physician.  COLONOSCOPY: 03-27-19. - Cytology - PAP( [place])  2. Postmenopause Postmenopause, well on no HRT.  No PMB.  No pelvic pain.  3. Vulvar  itching Pt c/o vulvar itching. Wet prep negative.  Possible yeast at vulva only.  Will treat with Mycolog.  Vulvar application as needed.  Prescription sent to pharmacy. - WET PREP FOR TRICH, YEAST, CLUE  Other orders - nystatin-triamcinolone ointment (MYCOLOG); Apply 1 Application topically 2 (two) times daily. Vulvar application as needed.   Genia Del MD, 4:06 PM

## 2022-11-17 LAB — CYTOLOGY - PAP: Diagnosis: NEGATIVE

## 2023-04-22 ENCOUNTER — Ambulatory Visit: Payer: BC Managed Care – PPO | Admitting: Obstetrics and Gynecology

## 2023-04-22 ENCOUNTER — Encounter: Payer: Self-pay | Admitting: Obstetrics and Gynecology

## 2023-04-22 VITALS — BP 98/62

## 2023-04-22 DIAGNOSIS — R350 Frequency of micturition: Secondary | ICD-10-CM

## 2023-04-22 DIAGNOSIS — A6004 Herpesviral vulvovaginitis: Secondary | ICD-10-CM | POA: Diagnosis not present

## 2023-04-22 LAB — URINALYSIS, COMPLETE W/RFL CULTURE
Bacteria, UA: NONE SEEN /[HPF]
Bilirubin Urine: NEGATIVE
Glucose, UA: NEGATIVE
Hgb urine dipstick: NEGATIVE
Hyaline Cast: NONE SEEN /[LPF]
Ketones, ur: NEGATIVE
Leukocyte Esterase: NEGATIVE
Nitrites, Initial: NEGATIVE
Protein, ur: NEGATIVE
RBC / HPF: NONE SEEN /[HPF] (ref 0–2)
Specific Gravity, Urine: 1.015 (ref 1.001–1.035)
WBC, UA: NONE SEEN /[HPF] (ref 0–5)
pH: 7 (ref 5.0–8.0)

## 2023-04-22 LAB — NO CULTURE INDICATED

## 2023-04-22 MED ORDER — VALACYCLOVIR HCL 500 MG PO TABS: 500.0000 mg | ORAL_TABLET | Freq: Two times a day (BID) | ORAL | 3 refills | Status: DC

## 2023-04-22 NOTE — Progress Notes (Signed)
57 y.o. G0P0000 female here for vaginal lesion. Married. Reports history of herpes.  Notes painful/itchy vaginal lesions that have not gotten better with mycolog. Reports evaluated by PCP 1 year ago for similar symptoms (on thigh) and serum test for HSV was positive.  GYN HISTORY: No significant history  OB History  Gravida Para Term Preterm AB Living  0 0 0 0 0 0  SAB IAB Ectopic Multiple Live Births  0 0 0 0      Past Medical History:  Diagnosis Date   Anemia    Chalazion of both eyes    Endometrial polyp    Endometriosis    GERD (gastroesophageal reflux disease)    Lumbar herniated disc    L5   Pain in both lower legs    Reactive airway disease    exposure to sand blasting   Urinary frequency    Vitamin D deficiency     Past Surgical History:  Procedure Laterality Date   BREAST BIOPSY  2008   No malignancy   COLONOSCOPY  03/27/2019   DILATATION & CURETTAGE/HYSTEROSCOPY WITH MYOSURE N/A 11/10/2017   Procedure: DILATATION & CURETTAGE/HYSTEROSCOPY WITH MYOSURE;  Surgeon: Genia Del, MD;  Location: [facility];  Service: Gynecology;  Laterality: N/A;  request to follow around 10:15am in [facility] time requests one hour   DILATION AND CURETTAGE OF UTERUS  2005   PELVIC LAPAROSCOPY      Current Outpatient Medications on File Prior to Visit  Medication Sig Dispense Refill   Multiple Vitamin (MULTIVITAMIN PO) Take by mouth.     nystatin-triamcinolone ointment (MYCOLOG) Apply 1 Application topically 2 (two) times daily. Vulvar application as needed. 30 g 2   VITAMIN D PO Take by mouth daily.     [DISCONTINUED] Norethindrone Acetate-Ethinyl Estradiol (JUNEL,LOESTRIN,MICROGESTIN) 1.5-30 MG-MCG tablet Take 1 tablet by mouth daily. 1 Package 11   No current facility-administered medications on file prior to visit.    No Known Allergies    PE Today's Vitals   04/22/23 1553  BP: 98/62   There is no height or weight on file to  calculate BMI.  Physical Exam Vitals reviewed. Exam conducted with a chaperone present.  Constitutional:      General: She is not in acute distress.    Appearance: Normal appearance.  HENT:     Head: Normocephalic and atraumatic.     Nose: Nose normal.  Eyes:     Extraocular Movements: Extraocular movements intact.     Conjunctiva/sclera: Conjunctivae normal.  Pulmonary:     Effort: Pulmonary effort is normal.  Abdominal:     Tenderness: There is abdominal tenderness in the suprapubic area.     Comments: Mild TTP  Genitourinary:    Vagina: Normal.     Cervix: Normal.       Comments: Clusters of ulcerations along R vulva Deferred bimanual exam Musculoskeletal:        General: Normal range of motion.     Cervical back: Normal range of motion.  Neurological:     General: No focal deficit present.     Mental Status: She is alert.  Psychiatric:        Mood and Affect: Mood normal.        Behavior: Behavior normal.       Assessment and Plan:        Herpes simplex vulvovaginitis -     valACYclovir HCl; Take 1 tablet (500 mg total) by mouth 2 (two) times  daily for 3 days. With outbreaks  Dispense: 30 tablet; Refill: 3  Urinary frequency -     Urinalysis,Complete w/RFL Culture   Declined topical agents for pain.  Rosalyn Gess, MD

## 2023-07-06 ENCOUNTER — Other Ambulatory Visit: Payer: Self-pay | Admitting: Obstetrics and Gynecology

## 2023-07-06 DIAGNOSIS — Z1231 Encounter for screening mammogram for malignant neoplasm of breast: Secondary | ICD-10-CM

## 2023-07-18 ENCOUNTER — Other Ambulatory Visit: Payer: Self-pay | Admitting: Obstetrics and Gynecology

## 2023-07-18 DIAGNOSIS — A6004 Herpesviral vulvovaginitis: Secondary | ICD-10-CM

## 2023-07-20 NOTE — Telephone Encounter (Signed)
Medication refill request: valtrex 500mg  Last AEX:  11-11-22 Next AEX: 11-15-23 Last MMG (if hormonal medication request): n/a Refill authorized: last filled 04-22-23. Please approve or deny as appropriate

## 2023-11-01 ENCOUNTER — Ambulatory Visit
Admission: RE | Admit: 2023-11-01 | Discharge: 2023-11-01 | Disposition: A | Discharge status: Home / Self Care | Payer: Self-pay | Source: Ambulatory Visit | Attending: Obstetrics and Gynecology | Admitting: Obstetrics and Gynecology

## 2023-11-01 DIAGNOSIS — Z1231 Encounter for screening mammogram for malignant neoplasm of breast: Secondary | ICD-10-CM

## 2023-11-04 ENCOUNTER — Encounter: Payer: Self-pay | Admitting: Obstetrics and Gynecology

## 2023-11-15 ENCOUNTER — Ambulatory Visit: Payer: BC Managed Care – PPO | Admitting: Obstetrics and Gynecology

## 2023-12-13 ENCOUNTER — Encounter: Payer: Self-pay | Admitting: Obstetrics and Gynecology

## 2023-12-13 ENCOUNTER — Ambulatory Visit (INDEPENDENT_AMBULATORY_CARE_PROVIDER_SITE_OTHER): Admitting: Obstetrics and Gynecology

## 2023-12-13 VITALS — BP 112/66 | HR 80 | Ht 60.5 in | Wt 124.0 lb

## 2023-12-13 DIAGNOSIS — Z1331 Encounter for screening for depression: Secondary | ICD-10-CM

## 2023-12-13 DIAGNOSIS — Z01419 Encounter for gynecological examination (general) (routine) without abnormal findings: Secondary | ICD-10-CM | POA: Diagnosis not present

## 2023-12-13 DIAGNOSIS — R102 Pelvic and perineal pain: Secondary | ICD-10-CM | POA: Diagnosis not present

## 2023-12-13 DIAGNOSIS — A6004 Herpesviral vulvovaginitis: Secondary | ICD-10-CM | POA: Diagnosis not present

## 2023-12-13 MED ORDER — NYSTATIN-TRIAMCINOLONE 100000-0.1 UNIT/GM-% EX OINT: 1.0000 | TOPICAL_OINTMENT | Freq: Two times a day (BID) | CUTANEOUS | 1 refills | Status: AC

## 2023-12-13 MED ORDER — VALACYCLOVIR HCL 500 MG PO TABS: 500.0000 mg | ORAL_TABLET | Freq: Two times a day (BID) | ORAL | 1 refills | Status: AC

## 2023-12-13 NOTE — Patient Instructions (Signed)

## 2023-12-13 NOTE — Progress Notes (Signed)
 58 y.o. G9P0000 Married Hispanic female here for annual exam.    Has hysteroscopy 11/10/2017.  Pathology showed endometrial polyp with simple hyperplasia.  No atypia or malignancy.  She did not do any therapy following this.   No period since 10/02/18.   No spotting.   Some pelvic cramping.  She would like evaluation.   Has some external itching.  Has used Mycolog.   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    Hot flashes and then feels cold.   Saw her PCP in April.  Her lab work was ok.  Cholesterol was about 220 per patient.  Not on medication.   PCP: Ransom Byers, MD   Patient's last menstrual period was 10/02/2018.           Sexually active: Yes.    The current method of family planning is post menopausal status.    Menopausal hormone therapy:  n/a Exercising: Yes.    Walking and weight lifting  Smoker:  no  OB History  Gravida Para Term Preterm AB Living  0 0 0 0 0 0  SAB IAB Ectopic Multiple Live Births  0 0 0 0      HEALTH MAINTENANCE: Last 2 paps:  11/11/22 neg, 11/05/21 neg  History of abnormal Pap or positive HPV:  no Mammogram:   11/01/23 Breast Density Cat B, BIRADS Cat 1 neg  Colonoscopy:  03/27/19, poyps, due in 2027. Bone Density:  n/a  Result  n/a    There is no immunization history on file for this patient.    reports that she has never smoked. She has never used smokeless tobacco. She reports that she does not currently use alcohol. She reports that she does not currently use drugs.  Past Medical History:  Diagnosis Date   Anemia    Chalazion of both eyes    Endometrial polyp    Endometriosis    GERD (gastroesophageal reflux disease)    Lumbar herniated disc    L5   Pain in both lower legs    Reactive airway disease    exposure to sand blasting   Urinary frequency    Vitamin D  deficiency     Past Surgical History:  Procedure Laterality Date   BREAST BIOPSY  2008   No malignancy   COLONOSCOPY  03/27/2019   DILATATION &  CURETTAGE/HYSTEROSCOPY WITH MYOSURE N/A 11/10/2017   Procedure: DILATATION & CURETTAGE/HYSTEROSCOPY WITH MYOSURE;  Surgeon: Lavoie, Marie-Lyne, MD;  Location: [facility];  Service: Gynecology;  Laterality: N/A;  request to follow around 10:15am in [facility] time requests one hour   DILATION AND CURETTAGE OF UTERUS  2005   PELVIC LAPAROSCOPY      Current Outpatient Medications  Medication Sig Dispense Refill   MAGNESIUM PO Take by mouth.     Multiple Vitamin (MULTIVITAMIN PO) Take by mouth.     VITAMIN D  PO Take by mouth daily.     nystatin -triamcinolone  ointment (MYCOLOG) Apply 1 Application topically 2 (two) times daily. Vulvar application as needed. (Patient not taking: Reported on 12/13/2023) 30 g 2   valACYclovir  (VALTREX ) 500 MG tablet TAKE 1 TABLET BY MOUTH 2 TIMES DAILY FOR 3 DAYS. WITH OUTBREAKS (Patient not taking: Reported on 12/13/2023) 90 tablet 1   No current facility-administered medications for this visit.    ALLERGIES: Patient has no known allergies.  Family History  Problem Relation Age of Onset   Prostate cancer Father 18       Deceased  Lymphoma Mother 16       Neck-Deceased   COPD Maternal Grandmother    Healthy Brother        x5   Healthy Sister        x8   Breast cancer Neg Hx    Colon cancer Neg Hx    Esophageal cancer Neg Hx    Rectal cancer Neg Hx    Stomach cancer Neg Hx     Review of Systems  All other systems reviewed and are negative.   PHYSICAL EXAM:  BP 112/66 (BP Location: Left Arm, Patient Position: Sitting)   Pulse 80   Ht 5' 0.5 (1.537 m)   Wt 124 lb (56.2 kg)   LMP 10/02/2018 Comment: sexually active  SpO2 97%   BMI 23.82 kg/m     General appearance: alert, cooperative and appears stated age Head: normocephalic, without obvious abnormality, atraumatic Neck: no adenopathy, supple, symmetrical, trachea midline and thyroid  normal to inspection and palpation Lungs: clear to auscultation  bilaterally Breasts: normal appearance, no masses or tenderness, No nipple retraction or dimpling, No nipple discharge or bleeding, No axillary adenopathy Heart: regular rate and rhythm Abdomen: soft, non-tender; no masses, no organomegaly Extremities: extremities normal, atraumatic, no cyanosis or edema Skin: skin color, texture, turgor normal. No rashes or lesions Lymph nodes: cervical, supraclavicular, and axillary nodes normal. Neurologic: grossly normal  Pelvic: External genitalia:  no lesions              No abnormal inguinal nodes palpated.              Urethra:  normal appearing urethra with no masses, tenderness or lesions              Bartholins and Skenes: normal                 Vagina: normal appearing vagina with normal color and discharge, no lesions              Cervix: no lesions              Pap taken: no Bimanual Exam:  Uterus:  normal size, contour, position, consistency, mobility, non-tender              Adnexa: no mass, fullness, tenderness              Rectal exam: yes.  Confirms.              Anus:  normal sphincter tone, no lesions  Chaperone was present for exam:  Nelva Bang, RN  ASSESSMENT: Well woman visit with gynecologic exam. HSV genital. Hx vulvitis.  Hx hysteroscopic polypectomy - simple endometrial hyperplasia, 2019.   Pelvic cramping.  PHQ-9: 0  PLAN: Mammogram screening discussed. Self breast awareness reviewed. Pap and HRV collected:  no.  Due in 2027.  Guidelines for Calcium , Vitamin D , regular exercise program including cardiovascular and weight bearing exercise. Medication refills:  Valtrex  500 gm po bid x 3 days prn, Mycolog bid for one week prn.  Labs with PCP. Return for pelvic US .  Follow up:  yearly and prn.

## 2024-01-05 ENCOUNTER — Ambulatory Visit (INDEPENDENT_AMBULATORY_CARE_PROVIDER_SITE_OTHER)

## 2024-01-05 ENCOUNTER — Encounter: Payer: Self-pay | Admitting: Obstetrics and Gynecology

## 2024-01-05 ENCOUNTER — Ambulatory Visit (INDEPENDENT_AMBULATORY_CARE_PROVIDER_SITE_OTHER): Admitting: Obstetrics and Gynecology

## 2024-01-05 VITALS — BP 112/76 | HR 81

## 2024-01-05 DIAGNOSIS — R102 Pelvic and perineal pain: Secondary | ICD-10-CM

## 2024-01-05 DIAGNOSIS — D252 Subserosal leiomyoma of uterus: Secondary | ICD-10-CM

## 2024-01-05 NOTE — Progress Notes (Signed)
 GYNECOLOGY  VISIT   HPI: 58 y.o.   Married  Hispanic female   [id number] with Patient's last menstrual period was 10/02/2018.   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  Has some cramping.  No bleeding.   GYNECOLOGIC HISTORY: Patient's last menstrual period was 10/02/2018. Contraception:  PMP Menopausal hormone therapy:  n/a Last 2 paps:  11/11/22 neg, 11/05/21 neg  History of abnormal Pap or positive HPV:  no Mammogram:  11/01/23 Breast Density Cat B, BIRADS Cat 1 neg         OB History     Gravida  0   Para  0   Term  0   Preterm  0   AB  0   Living  0      SAB  0   IAB  0   Ectopic  0   Multiple  0   Live Births                 Patient Active Problem List   Diagnosis Date Noted   Close exposure to COVID-19 virus 12/01/2018   Reactive airway disease 09/03/2017   Strep pharyngitis 12/30/2016   Hyperlipidemia 08/12/2015   H/O vitamin D  deficiency 08/12/2015   Alopecia 08/12/2015   Amenorrhea 08/12/2015   Cervical radicular pain 05/20/2015   Drug rash 05/20/2015   Low serum vitamin D  05/20/2015    Past Medical History:  Diagnosis Date   Anemia    Chalazion of both eyes    Endometrial polyp    Endometriosis    GERD (gastroesophageal reflux disease)    Lumbar herniated disc    L5   Pain in both lower legs    Reactive airway disease    exposure to sand blasting   Urinary frequency    Vitamin D  deficiency     Past Surgical History:  Procedure Laterality Date   BREAST BIOPSY  2008   No malignancy   COLONOSCOPY  03/27/2019   DILATATION & CURETTAGE/HYSTEROSCOPY WITH MYOSURE N/A 11/10/2017   Procedure: DILATATION & CURETTAGE/HYSTEROSCOPY WITH MYOSURE;  Surgeon: Lavoie, Marie-Lyne, MD;  Location: [facility];  Service: Gynecology;  Laterality: N/A;  request to follow around 10:15am in [facility] time requests one hour   DILATION AND CURETTAGE OF UTERUS   2005   PELVIC LAPAROSCOPY      Current Outpatient Medications  Medication Sig Dispense Refill   MAGNESIUM PO Take by mouth.     Multiple Vitamin (MULTIVITAMIN PO) Take by mouth.     nystatin -triamcinolone  ointment (MYCOLOG) Apply 1 Application topically 2 (two) times daily. Apply twice daily for 1 week as needed for vulvar irritation. 30 g 1   valACYclovir  (VALTREX ) 500 MG tablet Take 1 tablet (500 mg total) by mouth 2 (two) times daily. Take twice daily for 3 days as needed for an outbreak . 30 tablet 1   VITAMIN D  PO Take by mouth daily.     No current facility-administered medications for this visit.     ALLERGIES: Patient has no known allergies.  Family History  Problem Relation Age of Onset   Prostate cancer Father 80       Deceased   Lymphoma Mother 25       Neck-Deceased   COPD Maternal Grandmother    Healthy Brother        x5   Healthy Sister        x8  Breast cancer Neg Hx    Colon cancer Neg Hx    Esophageal cancer Neg Hx    Rectal cancer Neg Hx    Stomach cancer Neg Hx     Social History   Socioeconomic History   Marital status: Married    Spouse name: Not on file   Number of children: Not on file   Years of education: Not on file   Highest education level: Not on file  Occupational History   Not on file  Tobacco Use   Smoking status: Never   Smokeless tobacco: Never  Vaping Use   Vaping status: Never Used  Substance and Sexual Activity   Alcohol use: Not Currently    Alcohol/week: 0.0 standard drinks of alcohol   Drug use: Not Currently   Sexual activity: Yes    Partners: Male    Birth control/protection: Post-menopausal    Comment: 1ST INTERCOURSE- 77, PARTNERS- 3  Other Topics Concern   Not on file  Social History Narrative   Not on file   Social Drivers of Health   Financial Resource Strain: Not on file  Food Insecurity: Not on file  Transportation Needs: Not on file  Physical Activity: Not on file  Stress: Not on file  Social  Connections: Not on file  Intimate Partner Violence: Not on file    Review of Systems  All other systems reviewed and are negative.   PHYSICAL EXAMINATION:   BP 112/76 (BP Location: Left Arm, Patient Position: Sitting)   Pulse 81   LMP 10/02/2018 Comment: sexually active  SpO2 97%     General appearance: alert, cooperative and appears stated age   Pelvic US  Uterus 4.94 x 3.17 x 2.33 cm.  Posterior subserosal fibroid 1.16 cm.  Stable.  EMS 2.06 mm.  Left ovary 1.95 x 1.89 x 0.82 cm.  Atrophic.  Normal perfusion.  Right ovary 1.80 x 1.63 x 1.34 cm.  Atrophic.  Normal perfusion.  No adnexal mass.  No free fluid.   ASSESSMENT:  Hx hysteroscopic polypectomy - simple endometrial hyperplasia, 2019.   Pelvic cramping.  Uterine fibroid.   PLAN:  Pelvic US  images and reports reviewed.  Prior pelvic US  images also reviewed dating back to 2019.  Reassurance given.  Uterine fibroid discussed.  If pelvic cramping persists, I recommend she see her PCP to rule out other causes.  Fu here for annual exam and prn.

## 2024-01-05 NOTE — Patient Instructions (Signed)
 Fibroids in the Uterus: What to Know  Fibroids are growths (tumors) in the uterus. Fibroids are not cancer. Most people with this condition do not need treatment. Sometimes, fibroids can make it hard to get pregnant. If this happens, you may need surgery to take out the fibroids. What are the causes? The cause of this condition is not known. What increases the risk? You're in your 30s or 40s and have not stopped having a period. You have family members who have had fibroids. You're of African American descent. You started your period at age 63 or younger. You've not given birth. You're overweight or you're obese. You eat a diet low in fruits, vegetables, and vitamin D . What are the signs or symptoms? Heavy bleeding during your period. Bleeding between periods. Pain in the area between your hips (pelvis). Pain during sex. Needing to pee right away or peeing more often than usual. Not being able to have children. Not being able to stay pregnant (miscarriage). Many people do not have symptoms. How is this treated? Treatment may include: Medicines to help with pain, such as aspirin or ibuprofen. Hormone treatment. This may be given as a pill, in a shot, or with a type of birth control device called an IUD. Surgery. This may be done to: Take out the fibroids. This may be done if you want to become pregnant. Take out the uterus. Stop the blood flow to the fibroids. Procedures to shrink the fibroids. This may be done with: Heat and radio energy. Ultrasound waves. Follow these instructions at home: Medicines Take your medicines only as told. You can lose a lot of iron because of heavy bleeding from your period. Ask your doctor if you should: Take iron pills. Eat more foods that have iron in them, such as dark green, leafy vegetables. Managing pain  Put heat on your back or belly as told. Use the heat source that your doctor recommends, such as a moist heat pack or a heating pad. Do  this as often as told. Put a towel between your skin and the heat source. Leave the heat on for 20-30 minutes. If your skin turns red, take off the heat right away to prevent burns. The risk of burns is higher if you can't feel pain, heat, or cold. General instructions Tell your doctor about any changes in your period, such as: Heavy bleeding that needs a change of tampons or pads more than normal. A change in how many days your period lasts. A change in symptoms that come with your period. This might be cramps in your belly or pain in your back. Keep all follow-up visits. Your doctor needs to check your fibroids for any changes. Contact a doctor if: You have pain that does not get better with medicine or heat. This may include pain or cramps in: The area between your hip bones. Your back. Your belly. You have new bleeding between your periods. You have more bleeding during or between your periods. You feel very tired or weak. You feel dizzy. Get help right away if: You faint. You have pain in the area between your hip bones that gets worse. You have bleeding that soaks a tampon or pad in 30 minutes or less. This information is not intended to replace advice given to you by your health care provider. Make sure you discuss any questions you have with your health care provider. Document Revised: 12/15/2022 Document Reviewed: 12/15/2022 Elsevier Patient Education  2024 [employer].

## 2024-12-13 ENCOUNTER — Ambulatory Visit: Admitting: Obstetrics and Gynecology
